# Patient Record
Sex: Male | Born: 1995 | Hispanic: Yes | Marital: Married | Smoking: Never smoker
Health system: Southern US, Community
[De-identification: ages and names within clinical notes are randomized; demographics above are authoritative.]

## PROBLEM LIST (undated history)

## (undated) DIAGNOSIS — E119 Type 2 diabetes mellitus without complications: Secondary | ICD-10-CM

## (undated) HISTORY — PX: LIVER SURGERY: SHX698

---

## 2021-08-27 ENCOUNTER — Inpatient Hospital Stay (HOSPITAL_COMMUNITY)
Admission: EM | Admit: 2021-08-27 | Discharge: 2021-09-07 | DRG: 442 | Disposition: A | Discharge status: Home / Self Care | Payer: Self-pay | Attending: Student | Admitting: Student

## 2021-08-27 ENCOUNTER — Other Ambulatory Visit: Payer: Self-pay

## 2021-08-27 ENCOUNTER — Encounter (HOSPITAL_COMMUNITY): Payer: Self-pay

## 2021-08-27 DIAGNOSIS — K750 Abscess of liver: Principal | ICD-10-CM | POA: Diagnosis present

## 2021-08-27 DIAGNOSIS — B9620 Unspecified Escherichia coli [E. coli] as the cause of diseases classified elsewhere: Secondary | ICD-10-CM | POA: Diagnosis present

## 2021-08-27 DIAGNOSIS — D72829 Elevated white blood cell count, unspecified: Secondary | ICD-10-CM | POA: Diagnosis present

## 2021-08-27 DIAGNOSIS — Z758 Other problems related to medical facilities and other health care: Secondary | ICD-10-CM

## 2021-08-27 DIAGNOSIS — D72825 Bandemia: Secondary | ICD-10-CM

## 2021-08-27 DIAGNOSIS — R1013 Epigastric pain: Secondary | ICD-10-CM

## 2021-08-27 DIAGNOSIS — Z789 Other specified health status: Secondary | ICD-10-CM

## 2021-08-27 DIAGNOSIS — D75839 Thrombocytosis, unspecified: Secondary | ICD-10-CM | POA: Diagnosis present

## 2021-08-27 DIAGNOSIS — E1165 Type 2 diabetes mellitus with hyperglycemia: Secondary | ICD-10-CM | POA: Diagnosis present

## 2021-08-27 DIAGNOSIS — E871 Hypo-osmolality and hyponatremia: Secondary | ICD-10-CM

## 2021-08-27 DIAGNOSIS — D649 Anemia, unspecified: Secondary | ICD-10-CM

## 2021-08-27 HISTORY — DX: Type 2 diabetes mellitus without complications: E11.9

## 2021-08-27 NOTE — ED Triage Notes (Signed)
Patient has been vomiting for 3 days. Yesterday he had dark stools, today he had bright red in his stools. Having epigastric abdominal pain.

## 2021-08-28 ENCOUNTER — Encounter (HOSPITAL_COMMUNITY): Payer: Self-pay | Admitting: Internal Medicine

## 2021-08-28 ENCOUNTER — Emergency Department (HOSPITAL_COMMUNITY): Payer: Self-pay

## 2021-08-28 ENCOUNTER — Inpatient Hospital Stay (HOSPITAL_COMMUNITY): Payer: Self-pay

## 2021-08-28 DIAGNOSIS — R1013 Epigastric pain: Secondary | ICD-10-CM

## 2021-08-28 DIAGNOSIS — E1165 Type 2 diabetes mellitus with hyperglycemia: Secondary | ICD-10-CM

## 2021-08-28 DIAGNOSIS — K750 Abscess of liver: Secondary | ICD-10-CM | POA: Diagnosis present

## 2021-08-28 LAB — HIV ANTIBODY (ROUTINE TESTING W REFLEX): HIV Screen 4th Generation wRfx: NONREACTIVE

## 2021-08-28 LAB — LACTIC ACID, PLASMA
Lactic Acid, Venous: 1.2 mmol/L (ref 0.5–1.9)
Lactic Acid, Venous: 1 mmol/L (ref 0.5–1.9)

## 2021-08-28 LAB — COMPREHENSIVE METABOLIC PANEL
ALT: 39 U/L (ref 0–44)
AST: 28 U/L (ref 15–41)
Albumin: 2.9 g/dL — ABNORMAL LOW (ref 3.5–5.0)
Alkaline Phosphatase: 249 U/L — ABNORMAL HIGH (ref 38–126)
Anion gap: 15 (ref 5–15)
BUN: 10 mg/dL (ref 6–20)
CO2: 26 mmol/L (ref 22–32)
Calcium: 9.2 mg/dL (ref 8.9–10.3)
Chloride: 88 mmol/L — ABNORMAL LOW (ref 98–111)
Creatinine, Ser: 0.81 mg/dL (ref 0.61–1.24)
GFR, Estimated: >60 mL/min (ref >60)
Glucose, Bld: 306 mg/dL — ABNORMAL HIGH (ref 70–99)
Potassium: 4.1 mmol/L (ref 3.5–5.1)
Sodium: 129 mmol/L — ABNORMAL LOW (ref 135–145)
Total Bilirubin: 1.3 mg/dL — ABNORMAL HIGH (ref 0.3–1.2)
Total Protein: 9 g/dL — ABNORMAL HIGH (ref 6.5–8.1)

## 2021-08-28 LAB — URINALYSIS, ROUTINE W REFLEX MICROSCOPIC
Bilirubin Urine: NEGATIVE
Glucose, UA: >=500 mg/dL — AB
Ketones, ur: 80 mg/dL — AB
Leukocytes,Ua: NEGATIVE
Nitrite: NEGATIVE
Protein, ur: 100 mg/dL — AB
Specific Gravity, Urine: 1.028 (ref 1.005–1.030)
pH: 6 (ref 5.0–8.0)

## 2021-08-28 LAB — CBC
HCT: 40.7 % (ref 39.0–52.0)
Hemoglobin: 13.5 g/dL (ref 13.0–17.0)
MCH: 27.3 pg (ref 26.0–34.0)
MCHC: 33.2 g/dL (ref 30.0–36.0)
MCV: 82.2 fL (ref 80.0–100.0)
Platelets: 864 10*3/uL — ABNORMAL HIGH (ref 150–400)
RBC: 4.95 MIL/uL (ref 4.22–5.81)
RDW: 11.9 % (ref 11.5–15.5)
WBC: 19 10*3/uL — ABNORMAL HIGH (ref 4.0–10.5)
nRBC: 0 % (ref 0.0–0.2)

## 2021-08-28 LAB — SEDIMENTATION RATE: Sed Rate: 125 mm/hr — ABNORMAL HIGH (ref 0–16)

## 2021-08-28 LAB — GLUCOSE, CAPILLARY
Glucose-Capillary: 298 mg/dL — ABNORMAL HIGH (ref 70–99)
Glucose-Capillary: 204 mg/dL — ABNORMAL HIGH (ref 70–99)
Glucose-Capillary: 303 mg/dL — ABNORMAL HIGH (ref 70–99)
Glucose-Capillary: 256 mg/dL — ABNORMAL HIGH (ref 70–99)

## 2021-08-28 LAB — HEMOGLOBIN A1C
Hgb A1c MFr Bld: 12.3 % — ABNORMAL HIGH (ref 4.8–5.6)
Mean Plasma Glucose: 306.31 mg/dL

## 2021-08-28 LAB — LIPASE, BLOOD: Lipase: 22 U/L (ref 11–51)

## 2021-08-28 LAB — C-REACTIVE PROTEIN: CRP: 23.1 mg/dL — ABNORMAL HIGH (ref <1.0)

## 2021-08-28 MED ORDER — INSULIN ASPART 100 UNIT/ML IJ SOLN
0.0000 [IU] | Freq: Three times a day (TID) | INTRAMUSCULAR | Status: DC
  Administered 2021-08-28: 5 [IU] via SUBCUTANEOUS
  Administered 2021-08-28: 3 [IU] via SUBCUTANEOUS
  Administered 2021-08-28: 8 [IU] via SUBCUTANEOUS
  Administered 2021-08-28 – 2021-08-29 (×2): 11 [IU] via SUBCUTANEOUS
  Administered 2021-08-29 (×2): 8 [IU] via SUBCUTANEOUS
  Administered 2021-08-29 – 2021-08-30 (×2): 11 [IU] via SUBCUTANEOUS
  Administered 2021-08-30: 8 [IU] via SUBCUTANEOUS
  Administered 2021-08-30: 5 [IU] via SUBCUTANEOUS
  Administered 2021-08-30: 15 [IU] via SUBCUTANEOUS
  Administered 2021-08-31: 8 [IU] via SUBCUTANEOUS
  Administered 2021-08-31: 3 [IU] via SUBCUTANEOUS
  Administered 2021-08-31: 8 [IU] via SUBCUTANEOUS
  Administered 2021-08-31 – 2021-09-01 (×2): 5 [IU] via SUBCUTANEOUS
  Administered 2021-09-01: 3 [IU] via SUBCUTANEOUS
  Administered 2021-09-01: 11 [IU] via SUBCUTANEOUS
  Administered 2021-09-01 – 2021-09-02 (×2): 3 [IU] via SUBCUTANEOUS
  Administered 2021-09-02: 5 [IU] via SUBCUTANEOUS
  Administered 2021-09-03: 8 [IU] via SUBCUTANEOUS
  Administered 2021-09-03: 5 [IU] via SUBCUTANEOUS
  Administered 2021-09-03 – 2021-09-04 (×2): 3 [IU] via SUBCUTANEOUS
  Administered 2021-09-04: 5 [IU] via SUBCUTANEOUS
  Administered 2021-09-04: 2 [IU] via SUBCUTANEOUS
  Administered 2021-09-04: 5 [IU] via SUBCUTANEOUS
  Administered 2021-09-05: 8 [IU] via SUBCUTANEOUS
  Administered 2021-09-05: 2 [IU] via SUBCUTANEOUS
  Administered 2021-09-05: 5 [IU] via SUBCUTANEOUS
  Administered 2021-09-06 (×2): 2 [IU] via SUBCUTANEOUS
  Administered 2021-09-06: 5 [IU] via SUBCUTANEOUS
  Administered 2021-09-07: 3 [IU] via SUBCUTANEOUS

## 2021-08-28 MED ORDER — PIPERACILLIN-TAZOBACTAM 3.375 G IVPB
3.3750 g | Freq: Three times a day (TID) | INTRAVENOUS | Status: DC
  Administered 2021-08-28 – 2021-09-07 (×31): 3.375 g via INTRAVENOUS
  Filled 2021-08-28 (×30): qty 50

## 2021-08-28 MED ORDER — ACETAMINOPHEN 650 MG RE SUPP: 650.0000 mg | Freq: Four times a day (QID) | RECTAL | Status: DC | PRN

## 2021-08-28 MED ORDER — ONDANSETRON HCL 4 MG/2ML IJ SOLN
4.0000 mg | Freq: Once | INTRAMUSCULAR | Status: AC
  Administered 2021-08-28: 4 mg via INTRAVENOUS
  Filled 2021-08-28: qty 2

## 2021-08-28 MED ORDER — ONDANSETRON HCL 4 MG PO TABS: 4.0000 mg | ORAL_TABLET | Freq: Four times a day (QID) | ORAL | Status: DC | PRN

## 2021-08-28 MED ORDER — SODIUM CHLORIDE 0.9 % IV SOLN
INTRAVENOUS | Status: DC | PRN
  Administered 2021-08-28: 10 mL/h via INTRAVENOUS

## 2021-08-28 MED ORDER — ACETAMINOPHEN 325 MG PO TABS
650.0000 mg | ORAL_TABLET | Freq: Four times a day (QID) | ORAL | Status: DC | PRN
  Administered 2021-08-29 – 2021-09-03 (×3): 650 mg via ORAL
  Filled 2021-08-28 (×3): qty 2

## 2021-08-28 MED ORDER — MORPHINE SULFATE (PF) 2 MG/ML IV SOLN: 2.0000 mg | INTRAVENOUS | Status: DC | PRN

## 2021-08-28 MED ORDER — PANTOPRAZOLE 80MG IVPB - SIMPLE MED
80.0000 mg | Freq: Once | INTRAVENOUS | Status: AC
  Administered 2021-08-28: 80 mg via INTRAVENOUS
  Filled 2021-08-28: qty 80

## 2021-08-28 MED ORDER — PIPERACILLIN-TAZOBACTAM 3.375 G IVPB 30 MIN
3.3750 g | Freq: Once | INTRAVENOUS | Status: AC
  Administered 2021-08-28: 3.375 g via INTRAVENOUS
  Filled 2021-08-28: qty 50

## 2021-08-28 MED ORDER — IOHEXOL 300 MG/ML  SOLN
100.0000 mL | Freq: Once | INTRAMUSCULAR | Status: AC | PRN
Start: 2021-08-28 — End: 2021-08-28
  Administered 2021-08-28: 100 mL via INTRAVENOUS

## 2021-08-28 MED ORDER — ONDANSETRON HCL 4 MG/2ML IJ SOLN
4.0000 mg | Freq: Four times a day (QID) | INTRAMUSCULAR | Status: DC | PRN
  Administered 2021-08-29: 4 mg via INTRAVENOUS
  Filled 2021-08-28 (×2): qty 2

## 2021-08-28 MED ORDER — LACTATED RINGERS IV SOLN: INTRAVENOUS | Status: AC

## 2021-08-28 MED ORDER — OXYCODONE-ACETAMINOPHEN 5-325 MG PO TABS
1.0000 | ORAL_TABLET | ORAL | Status: DC | PRN
  Filled 2021-08-28: qty 1

## 2021-08-28 MED ORDER — POLYETHYLENE GLYCOL 3350 17 G PO PACK: 17.0000 g | PACK | Freq: Every day | ORAL | Status: DC | PRN

## 2021-08-28 MED ORDER — SODIUM CHLORIDE 0.9 % IV BOLUS
1000.0000 mL | Freq: Once | INTRAVENOUS | Status: AC
  Administered 2021-08-28: 1000 mL via INTRAVENOUS

## 2021-08-28 MED ORDER — GADOBUTROL 1 MMOL/ML IV SOLN
6.0000 mL | Freq: Once | INTRAVENOUS | Status: AC | PRN
  Administered 2021-08-28: 6 mL via INTRAVENOUS

## 2021-08-28 NOTE — Progress Notes (Signed)
  Progress Note   Patient: Adam Skinner XGX:[id number] DOB: 10-Nov-1995 DOA: 08/27/2021     0 DOS: the patient was seen and examined on 08/28/2021   Brief hospital course: 26 year old male with past medical history of diabetes mellitus type 2 who presented to [facility] emergency department with complaints of nausea and vomiting with epigastric pain.  CT imaging of the abdomen and pelvis was performed revealing a 7 cm poorly defined hypodense liver lesion in the right lobe of the liver was an additional 1.4 cm lesion also noted.  Thought to be hepatic abscesses however metastatic disease cannot be excluded.  Assessment and Plan: * Liver abscess At least 1 liver lesion on CT imaging of the abdomen in the setting of substantial leukocytosis and thrombocytosis F/u Entamoeba histolytica antibodies, CRP, ESR. Cont empiric intravenous Zosyn per ID recs Per ID, if ab neg for E histolytica, then plan consult IR for drainage  Uncontrolled diabetes mellitus with hyperglycemia, without long-term current use of insulin ([place]) Holding outpatient regimen of daily Amaryl Patient exhibiting poor control on arrival with blood sugars in the 300s with no evidence of anion gap Cont with SSI coverage Hemoglobin A1C pending Diabetic Diet     Subjective: Denies abd pain or nausea. Tolerating diet  Physical Exam: Vitals:   08/28/21 0356 08/28/21 0555 08/28/21 1002 08/28/21 1443  BP: 120/70 114/67 116/70 121/69  Pulse: 92 94 93 94  Resp: _0 Temp: 98.9 F (37.2 C) 99.3 F (37.4 C) 98.5 F (36.9 C) 98.4 F (36.9 C)  TempSrc: Oral Oral Oral Oral  SpO2: 98% 98% 99% 98%  Weight: 65.9 kg     Height: _1  (1.778 m)      General exam: Awake, laying in bed, in nad Respiratory system: Normal respiratory effort, no wheezing Cardiovascular system: regular rate, s1, s2 Gastrointestinal system: Soft, nondistended, positive BS Central nervous system: CN2-12 grossly intact, strength  intact Extremities: Perfused, no clubbing Skin: Normal skin turgor, no notable skin lesions seen Psychiatry: Mood normal // no visual hallucinations   Data Reviewed:  Labs reviewed: Na 129, Cr 0.81, TB 1.3, WBC 19  Family Communication: Pt in room, family not at bedside  Disposition: Status is: Inpatient Remains inpatient appropriate because: Severity of illness  Planned Discharge Destination: Home    Author: Marylu Lund, MD 08/28/2021 4:09 PM  For on call review [email or web address].

## 2021-08-28 NOTE — Assessment & Plan Note (Addendum)
   At least 1 liver lesion on CT imaging of the abdomen in the setting of substantial leukocytosis and thrombocytosis  While abscess is the most likely finding I am also particularly concerned for the possibility of an amoeba such as Entamoeba histolytica considering patient's recent travel to [place] and complains of intermittent blood in the stool.  Radiology is recommending follow-up with MRI imaging to better characterize the lesions  Additionally obtaining Entamoeba histolytica antibodies, CRP, ESR.  We will consult infectious disease on day shift for guidance  Once this is performed we will obtain infectious disease consultation for assistance with antibiotic management as well as interventional radiology consultation for aspiration  Development of bacterial abscess may be secondary to uncontrolled diabetes  Treating patient with intravenous Zosyn in the meantime  Additionally hydrating patient with intravenous isotonic fluids  Blood cultures have been obtained  As needed opiate-based analgesics for associated pain

## 2021-08-28 NOTE — Assessment & Plan Note (Addendum)
.   Holding outpatient regimen of daily Amaryl . Patient exhibiting poor control on arrival with blood sugars in the 300s with no evidence of anion gap . Patient been placed on Accu-Cheks before every meal and nightly with sliding scale insulins . Hemoglobin A1C ordered . Diabetic Diet

## 2021-08-28 NOTE — H&P (Addendum)
History and Physical    Patient: Adam Skinner MRN: [id number] DOA: 08/27/2021  Date of Service: the patient was seen and examined on 08/28/2021  Patient coming from: Home  Chief Complaint:  Chief Complaint  Patient presents with   Abdominal Pain    HPI:   26 year old male with past medical history of diabetes mellitus type 2 who presented to [facility] emergency department with complaints of nausea and vomiting with epigastric pain.    Patient explains that for the past 1 month he has had a sensation of generalized malaise and weakness.  This been associated with a 10 pound unintentional weight loss with intermittent subjective fevers and chills.  Of note, patient does endorse traveling to [place] approximately 2 months ago.  Patient's symptoms continued to worsen and over several days in particular the patient has lost his appetite altogether.  Over the past 1 to 2 days the patient has developed worsening nausea with frequent bouts of nonbilious nonbloody vomiting.  Patient also complains of a 1 day history of dark stool with "blood."  Upon further questioning of this she states that the blood is a small amount, bright red and in the form of streaks on his toilet paper consistent with hemorrhoids.  Due to patient's progressively worsening abdominal pain, nausea, vomiting, fevers, chills in setting of increasing weakness malaise and weight loss the patient eventually presented to [facility] emergency department for evaluation.  Patient denies any fevers, sick contacts, shortness of breath, chest pain, cough low back pain or dysuria.  Upon evaluation in the emergency department initial work-up revealed a substantial leukocytosis with white blood cell count of 19 and substantial thrombocytosis with platelet count of 864.  Due to abdominal complaints CT imaging of the abdomen and pelvis was performed revealing a 7 cm poorly defined hypodense liver lesion in the  right lobe of the liver was an additional 1.4 cm lesion also noted.  Thought to be hepatic abscesses however metastatic disease cannot be excluded.  Radiology is recommending obtaining MRI.  ER provider initiated intravenous Zosyn and the hospitalist group has now been called to assess the patient for admission to the hospital.    Review of Systems: Review of Systems  Gastrointestinal:  Positive for abdominal pain, blood in stool, nausea and vomiting.  Neurological:  Positive for weakness.     Past Medical History:  Diagnosis Date   Diabetes ([place])     History reviewed. No pertinent surgical history.  Social History:  reports that he has never smoked. He has never used smokeless tobacco. He reports that he does not currently use alcohol. He reports that he does not use drugs.  No Known Allergies  Family History  Problem Relation Age of Onset   Heart disease Neg Hx     Prior to Admission medications   Not on File    Physical Exam:  Vitals:   08/28/21 0130 08/28/21 0200 08/28/21 0356 08/28/21 0555  BP: 120/78 118/72 120/70 114/67  Pulse: 88 85 92 94  Resp:   18 18  Temp:   98.9 F (37.2 C) 99.3 F (37.4 C)  TempSrc:   Oral Oral  SpO2: 100% 100% 98% 98%  Weight:   65.9 kg   Height:   '5\' 10"'$  (1.778 m)     Constitutional: Awake alert and oriented x3, no associated distress.  Thin build.   Skin: no rashes, no lesions, poor skin turgor noted. Eyes: Pupils are equally reactive to light.  No  evidence of scleral icterus or conjunctival pallor.  ENMT: Dry mucous membranes noted.  Posterior pharynx clear of any exudate or lesions.   Neck: normal, supple, no masses, no thyromegaly.  No evidence of jugular venous distension.   Respiratory: clear to auscultation bilaterally, no wheezing, no crackles. Normal respiratory effort. No accessory muscle use.  Cardiovascular: Regular rate and rhythm, no murmurs / rubs / gallops. No extremity edema. 2+ pedal pulses. No carotid bruits.   Chest:   Nontender without crepitus or deformity.   Back:   Nontender without crepitus or deformity. Abdomen: Epigastric and RUQ tenderness.  Abdomen is soft.  No evidence of intra-abdominal masses.  Positive bowel sounds noted in all quadrants.   Musculoskeletal: No joint deformity upper and lower extremities. Good ROM, no contractures. Normal muscle tone.  Neurologic: CN 2-12 grossly intact. Sensation intact.  Patient moving all 4 extremities spontaneously.  Patient is following all commands.  Patient is responsive to verbal stimuli.   Psychiatric: Patient exhibits normal mood with appropriate affect.  Patient seems to possess insight as to their current situation.    Data Reviewed:  I have personally reviewed and interpreted labs, imaging.  Significant findings are:  Blood cell count of 19, hemoglobin of 13.5, hematocrit of 40.7 and platelet count of 864. Chemistry revealing sodium 129, potassium 4.1, chloride 88, bicarbonate 26, BUN 10, creatinine 0.81.  Albumin 2.9.  Glucose 306. Lipase 22 Lactic acid 1.2 7 cm hypodense liver lesion of the right lobe concerning for developing abscess with additional 1.4 cm lesion.  Likely hepatic abscess formation although metastatic disease is also possible.  EKG: Personally reviewed.  Rhythm is NSR with heart rate of 94BPM.  No dynamic ST segment changes appreciated.    Assessment and Plan: * Liver abscess At least 1 liver lesion on CT imaging of the abdomen in the setting of substantial leukocytosis and thrombocytosis While abscess is the most likely finding I am also particularly concerned for the possibility of an amoeba such as Entamoeba histolytica considering patient's recent travel to [place] and complains of intermittent blood in the stool. Radiology is recommending follow-up with MRI imaging to better characterize the lesions Additionally obtaining Entamoeba histolytica antibodies, CRP, ESR. We will consult infectious disease on day  shift for guidance Once this is performed we will obtain infectious disease consultation for assistance with antibiotic management as well as interventional radiology consultation for aspiration Development of bacterial abscess may be secondary to uncontrolled diabetes Treating patient with intravenous Zosyn in the meantime Additionally hydrating patient with intravenous isotonic fluids Blood cultures have been obtained As needed opiate-based analgesics for associated pain  Uncontrolled diabetes mellitus with hyperglycemia, without long-term current use of insulin ([place]) Holding outpatient regimen of daily Amaryl Patient exhibiting poor control on arrival with blood sugars in the 300s with no evidence of anion gap Patient been placed on Accu-Cheks before every meal and nightly with sliding scale insulins Hemoglobin A1C ordered Diabetic Diet        Code Status:  Full code  code status decision has been confirmed with: patient Family Communication: deferred   Consults: Secure Chat consult request sent to Dr. Linus Salmons with Infectious Disease  Severity of Illness:  The appropriate patient status for this patient is INPATIENT. Inpatient status is judged to be reasonable and necessary in order to provide the required intensity of service to ensure the patient's safety. The patient's presenting symptoms, physical exam findings, and initial radiographic and laboratory data in the context of their chronic  comorbidities is felt to place them at high risk for further clinical deterioration. Furthermore, it is not anticipated that the patient will be medically stable for discharge from the hospital within 2 midnights of admission.   * I certify that at the point of admission it is my clinical judgment that the patient will require inpatient hospital care spanning beyond 2 midnights from the point of admission due to high intensity of service, high risk for further deterioration and high frequency of  surveillance required.*  Author:  Vernelle Emerald MD  08/28/2021 9:16 AM

## 2021-08-28 NOTE — ED Provider Notes (Signed)
Emergency Department Provider Note   I have reviewed the triage vital signs and the nursing notes.   HISTORY  Chief Complaint Abdominal Pain   Patient is accompanied by family who assist with interpretation.   HPI Adam Skinner is a 26 y.o. male with PMH of DM presents to the ED with epigastric abdominal pain, vomiting, fatigue, and intermittent dark BMs for the last 5 days. No similar symptoms in the past. No new medications. Patient having some subjective fever. No diarrhea. Patient notes some dark, hard stool and some brighter red streaking in the stool today. No CP. Only recent travel was to [place] 2 months prior.     Past Medical History:  Diagnosis Date   Diabetes (HCC)     Review of Systems  Constitutional: No fever/chills Eyes: No visual changes. ENT: No sore throat. Cardiovascular: Denies chest pain. Respiratory: Denies shortness of breath. Gastrointestinal: Positive abdominal pain. Positive nausea and vomiting.  No diarrhea.  No constipation. Genitourinary: Negative for dysuria. Musculoskeletal: Negative for back pain. Skin: Negative for rash. Neurological: Negative for headaches, focal weakness or numbness.  ____________________________________________   PHYSICAL EXAM:  VITAL SIGNS: ED Triage Vitals [08/27/21 2311]  Enc Vitals Group     BP 133/90     Pulse Rate (!) 120     Resp 16     Temp 99.2 F (37.3 C)     Temp Source Oral     SpO2 98 %    Constitutional: Alert and oriented. Well appearing and in no acute distress. Eyes: Conjunctivae are normal.  Head: Atraumatic. Nose: No congestion/rhinnorhea. Mouth/Throat: Mucous membranes are moist. Neck: No stridor.   Cardiovascular: Normal rate, regular rhythm. Good peripheral circulation. Grossly normal heart sounds.   Respiratory: Normal respiratory effort.  No retractions. Lungs CTAB. Gastrointestinal: Soft and nontender. No distention.  Musculoskeletal: No lower extremity tenderness  nor edema. No gross deformities of extremities. Neurologic:  Normal speech and language. No gross focal neurologic deficits are appreciated.  Skin:  Skin is warm, dry and intact. No rash noted.  ____________________________________________   LABS (all labs ordered are listed, but only abnormal results are displayed)  Labs Reviewed  COMPREHENSIVE METABOLIC PANEL - Abnormal; Notable for the following components:      Result Value   Sodium 129 (*)    Chloride 88 (*)    Glucose, Bld 306 (*)    Total Protein 9.0 (*)    Albumin 2.9 (*)    Alkaline Phosphatase 249 (*)    Total Bilirubin 1.3 (*)    All other components within normal limits  CBC - Abnormal; Notable for the following components:   WBC 19.0 (*)    Platelets 864 (*)    All other components within normal limits  URINALYSIS, ROUTINE W REFLEX MICROSCOPIC - Abnormal; Notable for the following components:   Glucose, UA >=500 (*)    Hgb urine dipstick MODERATE (*)    Ketones, ur 80 (*)    Protein, ur 100 (*)    Bacteria, UA RARE (*)    All other components within normal limits  CULTURE, BLOOD (ROUTINE X 2)  CULTURE, BLOOD (ROUTINE X 2)  LIPASE, BLOOD  LACTIC ACID, PLASMA  LACTIC ACID, PLASMA  HIV ANTIBODY (ROUTINE TESTING W REFLEX)   ____________________________________________  RADIOLOGY  CT ABDOMEN PELVIS W CONTRAST  Result Date: 08/28/2021 CLINICAL DATA:  Nausea, vomiting, and acute nonlocalized abdominal pain. EXAM: CT ABDOMEN AND PELVIS WITH CONTRAST TECHNIQUE: Multidetector CT imaging of the abdomen and  pelvis was performed using the standard protocol following bolus administration of intravenous contrast. RADIATION DOSE REDUCTION: This exam was performed according to the departmental dose-optimization program which includes automated exposure control, adjustment of the mA and/or kV according to patient size and/or use of iterative reconstruction technique. CONTRAST:  OMNIPAQUE IOHEXOL 300 MG/ML  SOLN  COMPARISON:  None Available. FINDINGS: Lower chest: Lung bases are clear. Hepatobiliary: Somewhat poorly defined hypodense liver lesion involving the right lobe and measuring about 7 cm in maximal diameter. A smaller circumscribed low-attenuation lesion is identified in hepatic segment 4 measuring 1.4 cm diameter. In a patient of this age, this is most likely to represent hepatic abscess. Metastasis or primary liver lesion could also have this appearance. Consider follow-up with MRI of the liver in the nonemergent setting. Gallbladder and bile ducts are unremarkable. Pancreas: Unremarkable. No pancreatic ductal dilatation or surrounding inflammatory changes. Spleen: Normal in size without focal abnormality. Adrenals/Urinary Tract: Adrenal glands are unremarkable. Kidneys are normal, without renal calculi, focal lesion, or hydronephrosis. Bladder is unremarkable. Stomach/Bowel: Stomach is within normal limits. Appendix appears normal. No evidence of bowel wall thickening, distention, or inflammatory changes. Vascular/Lymphatic: No significant vascular findings are present. No enlarged abdominal or pelvic lymph nodes. Reproductive: Prostate gland appears enlarged for age. Seminal vesicles are prominent. Possibly inflammatory process. Other: No free air or free fluid in the abdomen. Abdominal wall musculature appears intact. Musculoskeletal: No acute or significant osseous findings. IMPRESSION: 1. 7 cm poorly defined hypodense liver lesion in the right lobe with a 1.4 cm lesion in segment 4. This likely represents a hepatic abscess although primary or metastatic neoplasm could also have this appearance. Suggest follow-up with elective MRI of the liver. 2. Prostate gland and seminal vesicles are prominent for age, possibly inflammatory process. Electronically Signed   By: Burman Nieves M.D.   On: 08/28/2021 01:40    ____________________________________________   PROCEDURES  Procedure(s) performed:    Procedures  None  ____________________________________________   INITIAL IMPRESSION / ASSESSMENT AND PLAN / ED COURSE  Pertinent labs & imaging results that were available during my care of the patient were reviewed by me and considered in my medical decision making (see chart for details).   This patient is Presenting for Evaluation of abdominal pain, which does require a range of treatment options, and is a complaint that involves a high risk of morbidity and mortality.  The Differential Diagnoses includes but is not exclusive to acute cholecystitis, intrathoracic causes for epigastric abdominal pain, gastritis, duodenitis, pancreatitis, small bowel or large bowel obstruction, abdominal aortic aneurysm, hernia, gastritis, etc.   Critical Interventions-    Medications  piperacillin-tazobactam (ZOSYN) IVPB 3.375 g (has no administration in time range)  sodium chloride 0.9 % bolus 1,000 mL (0 mLs Intravenous Stopped 08/28/21 0130)  pantoprazole (PROTONIX) 80 mg /NS 100 mL IVPB (0 mg Intravenous Stopped 08/28/21 0130)  ondansetron (ZOFRAN) injection 4 mg (4 mg Intravenous Given 08/28/21 0045)  iohexol (OMNIPAQUE) 300 MG/ML solution 100 mL (100 mLs Intravenous Contrast Given 08/28/21 0111)    Reassessment after intervention: Symptoms improved.    I did obtain Additional Historical Information from family at bedside.    Clinical Laboratory Tests Ordered, included leukocytosis noted without anemia. No evidence of UTI.   Radiologic Tests Ordered, included CT abdomen/pelvis. I independently interpreted the images and agree with radiology interpretation.   Cardiac Monitor Tracing which shows NSR.   Social Determinants of Health Risk no regular EtOH.   Consult complete with Hospitalist, plan  for admit.   Medical Decision Making: Summary:  Patient presents to the emergency department for evaluation of abdominal pain with vomiting.  Some subjective fever noted.  Afebrile here with  normal vital signs.  Mild tenderness on exam.  Plan for CT imaging given leukocytosis and tenderness on exam and reassess.  Reevaluation with update and discussion with patient. Discussed CT findings and plan for abx and admit. He is in agreement.   Disposition: admit  ____________________________________________  FINAL CLINICAL IMPRESSION(S) / ED DIAGNOSES  Final diagnoses:  Liver abscess  Epigastric pain   Note:  This document was prepared using Dragon voice recognition software and may include unintentional dictation errors.  Alona Bene, MD, [facility] Emergency Medicine    Marice Guidone, Arlyss Repress, MD 08/28/21 [phone]

## 2021-08-28 NOTE — Progress Notes (Signed)
Pharmacy Antibiotic Note  Adam Skinner is a 26 y.o. male admitted on 08/27/2021 with Patient has been vomiting for 3 days. Yesterday he had dark stools, today he had bright red in his stools. Having epigastric abdominal pain. Marland Kitchen  Pharmacy has been consulted for zosyn dosing.  Plan: Zosyn 3.375g IV Q8H infused over 4hrs. Pharmacy will sign off and follow peripherally  Height: 5\' 10"  (177.8 cm) Weight: 65.9 kg (145 lb 4.5 oz) IBW/kg (Calculated) : 73  Temp (24hrs), Avg:98.9 F (37.2 C), Min:98.7 F (37.1 C), Max:99.2 F (37.3 C)  Recent Labs  Lab 08/27/21 2341 08/28/21 0228 08/28/21 0430  WBC 19.0*  --   --   CREATININE 0.81  --   --   LATICACIDVEN  --  1.2 1.0    Estimated Creatinine Clearance: 129.9 mL/min (by C-G formula based on SCr of 0.81 mg/dL).    No Known Allergies   Thank you for allowing pharmacy to be a part of this patient's care.  08/30/21 RPh 08/28/2021, 5:47 AM

## 2021-08-28 NOTE — ED Notes (Signed)
Patient transported to CT 

## 2021-08-28 NOTE — Consult Note (Signed)
    [facility]       Reason for Consult:liver abscess   Referring Physician: Dr. Leafy Half  Principal Problem:   Liver abscess Active Problems:   Uncontrolled diabetes mellitus with hyperglycemia, without long-term current use of insulin ([facility])    insulin aspart  0-15 Units Subcutaneous TID AC & HS    Recommendations: Continue piperacillin/tazobactam E histolytica If Ab negative for E histolytica, consult IR for drainage  Assessment: Liver abscess though MRI ordered since CT not definitive.  If abscess, need to be sure it is not amoebic prior to attempts at drainage.    Antibiotics: Pip/tazo  HPI: Adam Skinner is a 26 y.o. male from [place] in the [place] x 3 months, here with fatigue, fever, chills and CT scan with a liver abscess.  Has lost some weight.  Symptoms particularly in the last 2 days.  + leukocytosis of 19.0, afebrile here.  Eating ok.  Does eat street food in [place]  No history of liver issues.   Review of Systems:  Constitutional: positive for fevers and chills All other systems reviewed and are negative    Past Medical History:  Diagnosis Date   Diabetes (HCC)     Social History   Tobacco Use   Smoking status: Never   Smokeless tobacco: Never  Substance Use Topics   Alcohol use: Not Currently   Drug use: Never    Family History  Problem Relation Age of Onset   Heart disease Neg Hx     No Known Allergies  Physical Exam: Constitutional: in no apparent distress  Vitals:   08/28/21 0555 08/28/21 1002  BP: 114/67 116/70  Pulse: 94 93  Resp: 18 16  Temp: 99.3 F (37.4 C) 98.5 F (36.9 C)  SpO2: 98% 99%   EYES: anicteric Respiratory: normal respiratory effort GI: soft Musculoskeletal: no edema Skin: no rash  Lab Results  Component Value Date   WBC 19.0 (H) 08/27/2021   HGB 13.5 08/27/2021   HCT 40.7 08/27/2021   MCV 82.2 08/27/2021   PLT 864 (H) 08/27/2021    Lab Results  Component Value  Date   CREATININE 0.81 08/27/2021   BUN 10 08/27/2021   NA 129 (L) 08/27/2021   K 4.1 08/27/2021   CL 88 (L) 08/27/2021   CO2 26 08/27/2021    Lab Results  Component Value Date   ALT 39 08/27/2021   AST 28 08/27/2021   ALKPHOS 249 (H) 08/27/2021     Microbiology: No results found for this or any previous visit (from the past 240 hour(s)).  Gardiner Barefoot, MD [facility] [email or web address] 08/28/2021, 1:33 PM

## 2021-08-28 NOTE — Hospital Course (Signed)
26 year old male with past medical history of diabetes mellitus type 2 who presented to [facility] emergency department with complaints of nausea and vomiting with epigastric pain.  CT imaging of the abdomen and pelvis was performed revealing a 7 cm poorly defined hypodense liver lesion in the right lobe of the liver was an additional 1.4 cm lesion also noted.  Thought to be hepatic abscesses however metastatic disease cannot be excluded.

## 2021-08-29 DIAGNOSIS — R1013 Epigastric pain: Secondary | ICD-10-CM

## 2021-08-29 LAB — MAGNESIUM: Magnesium: 1.8 mg/dL (ref 1.7–2.4)

## 2021-08-29 LAB — CBC WITH DIFFERENTIAL/PLATELET
Abs Immature Granulocytes: 0.08 10*3/uL — ABNORMAL HIGH (ref 0.00–0.07)
Basophils Absolute: 0 10*3/uL (ref 0.0–0.1)
Basophils Relative: 0 %
Eosinophils Absolute: 0 10*3/uL (ref 0.0–0.5)
Eosinophils Relative: 0 %
HCT: 35 % — ABNORMAL LOW (ref 39.0–52.0)
Hemoglobin: 11.5 g/dL — ABNORMAL LOW (ref 13.0–17.0)
Immature Granulocytes: 1 %
Lymphocytes Relative: 5 %
Lymphs Abs: 0.5 10*3/uL — ABNORMAL LOW (ref 0.7–4.0)
MCH: 27 pg (ref 26.0–34.0)
MCHC: 32.9 g/dL (ref 30.0–36.0)
MCV: 82.2 fL (ref 80.0–100.0)
Monocytes Absolute: 0.2 10*3/uL (ref 0.1–1.0)
Monocytes Relative: 2 %
Neutro Abs: 10 10*3/uL — ABNORMAL HIGH (ref 1.7–7.7)
Neutrophils Relative %: 92 %
Platelets: 603 10*3/uL — ABNORMAL HIGH (ref 150–400)
RBC: 4.26 MIL/uL (ref 4.22–5.81)
RDW: 11.9 % (ref 11.5–15.5)
WBC: 10.9 10*3/uL — ABNORMAL HIGH (ref 4.0–10.5)
nRBC: 0 % (ref 0.0–0.2)

## 2021-08-29 LAB — COMPREHENSIVE METABOLIC PANEL
ALT: 34 U/L (ref 0–44)
AST: 35 U/L (ref 15–41)
Albumin: 2.3 g/dL — ABNORMAL LOW (ref 3.5–5.0)
Alkaline Phosphatase: 212 U/L — ABNORMAL HIGH (ref 38–126)
Anion gap: 11 (ref 5–15)
BUN: 9 mg/dL (ref 6–20)
CO2: 27 mmol/L (ref 22–32)
Calcium: 8.4 mg/dL — ABNORMAL LOW (ref 8.9–10.3)
Chloride: 92 mmol/L — ABNORMAL LOW (ref 98–111)
Creatinine, Ser: 0.76 mg/dL (ref 0.61–1.24)
GFR, Estimated: >60 mL/min (ref >60)
Glucose, Bld: 284 mg/dL — ABNORMAL HIGH (ref 70–99)
Potassium: 3.4 mmol/L — ABNORMAL LOW (ref 3.5–5.1)
Sodium: 130 mmol/L — ABNORMAL LOW (ref 135–145)
Total Bilirubin: 0.9 mg/dL (ref 0.3–1.2)
Total Protein: 7 g/dL (ref 6.5–8.1)

## 2021-08-29 LAB — GLUCOSE, CAPILLARY
Glucose-Capillary: 308 mg/dL — ABNORMAL HIGH (ref 70–99)
Glucose-Capillary: 331 mg/dL — ABNORMAL HIGH (ref 70–99)
Glucose-Capillary: 285 mg/dL — ABNORMAL HIGH (ref 70–99)
Glucose-Capillary: 306 mg/dL — ABNORMAL HIGH (ref 70–99)

## 2021-08-29 MED ORDER — POTASSIUM CHLORIDE CRYS ER 20 MEQ PO TBCR
60.0000 meq | EXTENDED_RELEASE_TABLET | Freq: Once | ORAL | Status: AC
  Administered 2021-08-29: 60 meq via ORAL
  Filled 2021-08-29: qty 3

## 2021-08-29 MED ORDER — INSULIN GLARGINE-YFGN 100 UNIT/ML ~~LOC~~ SOLN
12.0000 [IU] | Freq: Every day | SUBCUTANEOUS | Status: DC
Start: 2021-08-29 — End: 2021-08-30
  Administered 2021-08-29 – 2021-08-30 (×2): 12 [IU] via SUBCUTANEOUS
  Filled 2021-08-29 (×2): qty 0.12

## 2021-08-29 NOTE — Progress Notes (Signed)
Nutrition Note  RD consulted for nutrition education regarding diabetes.  Spanish Interpreter utilized: #[id number]  Lab Results  Component Value Date   HGBA1C 12.3 (H) 08/28/2021    RD provided "Carbohydrate Counting for People with Diabetes" handout from the Academy of Nutrition and Dietetics. Discussed different food groups and their effects on blood sugar, emphasizing carbohydrate-containing foods. Provided list of carbohydrates and recommended serving sizes of common foods.  Discussed importance of controlled and consistent carbohydrate intake throughout the day. Provided examples of ways to balance meals/snacks and encouraged intake of high-fiber, whole grain complex carbohydrates. Teach back method used.  Expect fair compliance. Pt reports having lost 10 lbs of weight loss since symptoms began. Was having vomiting and abdominal pain for 2-3 days PTA.  Pt now eating better. Did not like the meatloaf for lunch but ate everything else. Emphasized the importance of consuming protein foods with every meal and snack.  Body mass index is 20.85 kg/m. Pt meets criteria for normal based on current BMI.  Current diet order is CHO modified, patient is consuming approximately 90-100% of meals at this time. Labs and medications reviewed. No further nutrition interventions warranted at this time. If additional nutrition issues arise, please re-consult RD.  Tilda Franco, MS, RD, LDN Inpatient Clinical Dietitian Contact information available via Amion

## 2021-08-29 NOTE — Progress Notes (Signed)
Subjective: No new complaints   Antibiotics:  Anti-infectives (From admission, onward)    Start     Dose/Rate Route Frequency Ordered Stop   08/28/21 1000  piperacillin-tazobactam (ZOSYN) IVPB 3.375 g        3.375 g 12.5 mL/hr over 240 Minutes Intravenous Every 8 hours 08/28/21 0546     08/28/21 0215  piperacillin-tazobactam (ZOSYN) IVPB 3.375 g        3.375 g 100 mL/hr over 30 Minutes Intravenous  Once 08/28/21 0212 08/28/21 0333       Medications: Scheduled Meds:  insulin aspart  0-15 Units Subcutaneous TID AC & HS   insulin glargine-yfgn  12 Units Subcutaneous Daily   Continuous Infusions:  sodium chloride 10 mL/hr (08/28/21 1034)   piperacillin-tazobactam (ZOSYN)  IV 3.375 g (08/29/21 1655)   PRN Meds:.sodium chloride, acetaminophen **OR** acetaminophen, oxyCODONE-acetaminophen **OR** morphine injection, ondansetron **OR** ondansetron (ZOFRAN) IV, polyethylene glycol    Objective: Weight change:   Intake/Output Summary (Last 24 hours) at 08/29/2021 1835 Last data filed at 08/29/2021 1700 Gross per 24 hour  Intake 2005.51 ml  Output --  Net 2005.51 ml   Blood pressure 119/73, pulse 100, temperature 98.3 F (36.8 C), resp. rate 18, height 5\' 10"  (1.778 m), weight 65.9 kg, SpO2 99 %. Temp:  [97.5 F (36.4 C)-100.1 F (37.8 C)] 98.3 F (36.8 C) (06/19 1816) Pulse Rate:  [70-100] 100 (06/19 1816) Resp:  [16-20] 18 (06/19 1816) BP: (106-124)/(59-80) 119/73 (06/19 1816) SpO2:  [95 %-100 %] 99 % (06/19 1816)  Physical Exam: Physical Exam Constitutional:      Appearance: He is well-developed.  HENT:     Head: Normocephalic and atraumatic.  Eyes:     Conjunctiva/sclera: Conjunctivae normal.  Cardiovascular:     Rate and Rhythm: Normal rate and regular rhythm.  Pulmonary:     Effort: Pulmonary effort is normal. No respiratory distress.     Breath sounds: No wheezing.  Abdominal:     General: There is no distension.     Palpations: Abdomen is  soft. There is no mass.     Tenderness: There is abdominal tenderness.  Musculoskeletal:        General: Normal range of motion.     Cervical back: Normal range of motion and neck supple.  Skin:    General: Skin is warm and dry.     Findings: No erythema or rash.  Neurological:     General: No focal deficit present.     Mental Status: He is alert and oriented to person, place, and time.  Psychiatric:        Mood and Affect: Mood normal.        Behavior: Behavior normal.        Thought Content: Thought content normal.        Judgment: Judgment normal.      CBC:    BMET Recent Labs    08/27/21 2341 08/29/21 0354  NA 129* 130*  K 4.1 3.4*  CL 88* 92*  CO2 26 27  GLUCOSE 306* 284*  BUN 10 9  CREATININE 0.81 0.76  CALCIUM 9.2 8.4*     Liver Panel  Recent Labs    08/27/21 2341 08/29/21 0354  PROT 9.0* 7.0  ALBUMIN 2.9* 2.3*  AST 28 35  ALT 39 34  ALKPHOS 249* 212*  BILITOT 1.3* 0.9       Sedimentation Rate Recent Labs    08/28/21 0812  ESRSEDRATE 125*  C-Reactive Protein Recent Labs    08/28/21 0812  CRP 23.1*    Micro Results: Recent Results (from the past 720 hour(s))  Culture, blood (routine x 2)     Status: None (Preliminary result)   Collection Time: 08/28/21  2:28 AM   Specimen: BLOOD  Result Value Ref Range Status   Specimen Description   Final    BLOOD RIGHT ANTECUBITAL Performed at [facility], [address], [place], [place] [address]    Special Requests   Final    BOTTLES DRAWN AEROBIC AND ANAEROBIC Blood Culture adequate volume Performed at [facility], [address] [place], [place] [address]    Culture   Final    NO GROWTH < 24 HOURS Performed at [facility], [address] [place], [place] [address]    Report Status PENDING  Incomplete  Culture, blood (routine x 2)     Status: None (Preliminary result)   Collection Time: 08/28/21  2:28 AM   Specimen: BLOOD  Result  Value Ref Range Status   Specimen Description   Final    BLOOD LEFT ANTECUBITAL Performed at [facility], [address], [place], [place] [address]    Special Requests   Final    BOTTLES DRAWN AEROBIC AND ANAEROBIC Blood Culture adequate volume Performed at [facility], [address] [place], [place] [address]    Culture   Final    NO GROWTH < 24 HOURS Performed at [facility], [address], [place], [place] [address]    Report Status PENDING  Incomplete    Studies/Results: MR LIVER W WO CONTRAST  Result Date: 08/28/2021 CLINICAL DATA:  Indeterminate liver lesions on recent CT. EXAM: MRI ABDOMEN WITHOUT AND WITH CONTRAST TECHNIQUE: Multiplanar multisequence MR imaging of the abdomen was performed both before and after the administration of intravenous contrast. CONTRAST:  56mL GADAVIST GADOBUTROL 1 MMOL/ML IV SOLN COMPARISON:  CT on 08/28/2021 FINDINGS: Lower chest: No acute findings. Hepatobiliary: A complex cystic lesion is seen in segment 7 of the right hepatic lobe, measuring 8.2 x 5.5 cm. This shows thin peripherally enhancing wall and internal septations, and edema in the surrounding hepatic parenchyma. This is highly suspicious for liver abscess, with a necrotic neoplasm considered less likely. No other hepatic abscess or mass identified. Focal fatty infiltration is seen in segment 4 adjacent to the falciform ligament, which corresponds to the other lesion seen in this location on prior study. Gallbladder is unremarkable. No evidence of biliary ductal dilatation. Pancreas:  No mass or inflammatory changes. Spleen:  Within normal limits in size and appearance. Adrenals/Urinary Tract: No masses identified. No evidence of hydronephrosis. Stomach/Bowel: Unremarkable. Vascular/Lymphatic: No pathologically enlarged lymph nodes identified. No acute vascular findings. Other:  None. Musculoskeletal:  No suspicious bone lesions identified.  IMPRESSION: 8.2 cm complex cystic lesion in segment 7 of the right hepatic lobe, highly suspicious for liver abscess, with necrotic neoplasm considered less likely. Needle aspiration should be considered. Focal fatty infiltration in the left hepatic lobe, which corresponds to the other lesion seen on prior CT. Electronically Signed   By: Danae Orleans M.D.   On: 08/28/2021 16:02   CT ABDOMEN PELVIS W CONTRAST  Result Date: 08/28/2021 CLINICAL DATA:  Nausea, vomiting, and acute nonlocalized abdominal pain. EXAM: CT ABDOMEN AND PELVIS WITH CONTRAST TECHNIQUE: Multidetector CT imaging of the abdomen and pelvis was performed using the standard protocol following bolus administration of intravenous contrast. RADIATION  DOSE REDUCTION: This exam was performed according to the departmental dose-optimization program which includes automated exposure control, adjustment of the mA and/or kV according to patient size and/or use of iterative reconstruction technique. CONTRAST:  OMNIPAQUE IOHEXOL 300 MG/ML  SOLN COMPARISON:  None Available. FINDINGS: Lower chest: Lung bases are clear. Hepatobiliary: Somewhat poorly defined hypodense liver lesion involving the right lobe and measuring about 7 cm in maximal diameter. A smaller circumscribed low-attenuation lesion is identified in hepatic segment 4 measuring 1.4 cm diameter. In a patient of this age, this is most likely to represent hepatic abscess. Metastasis or primary liver lesion could also have this appearance. Consider follow-up with MRI of the liver in the nonemergent setting. Gallbladder and bile ducts are unremarkable. Pancreas: Unremarkable. No pancreatic ductal dilatation or surrounding inflammatory changes. Spleen: Normal in size without focal abnormality. Adrenals/Urinary Tract: Adrenal glands are unremarkable. Kidneys are normal, without renal calculi, focal lesion, or hydronephrosis. Bladder is unremarkable. Stomach/Bowel: Stomach is within normal limits.  Appendix appears normal. No evidence of bowel wall thickening, distention, or inflammatory changes. Vascular/Lymphatic: No significant vascular findings are present. No enlarged abdominal or pelvic lymph nodes. Reproductive: Prostate gland appears enlarged for age. Seminal vesicles are prominent. Possibly inflammatory process. Other: No free air or free fluid in the abdomen. Abdominal wall musculature appears intact. Musculoskeletal: No acute or significant osseous findings. IMPRESSION: 1. 7 cm poorly defined hypodense liver lesion in the right lobe with a 1.4 cm lesion in segment 4. This likely represents a hepatic abscess although primary or metastatic neoplasm could also have this appearance. Suggest follow-up with elective MRI of the liver. 2. Prostate gland and seminal vesicles are prominent for age, possibly inflammatory process. Electronically Signed   By: Burman Nieves M.D.   On: 08/28/2021 01:40      Assessment/Plan:  INTERVAL HISTORY: Entamoeba histolytica antibodies still pending   Principal Problem:   Liver abscess Active Problems:   Uncontrolled diabetes mellitus with hyperglycemia, without long-term current use of insulin (HCC)    Adam Skinner is a 26 y.o. male with history of poorly controlled diabetes mellitus admitted with apparent liver abscess.  He has been on empiric Zosyn and Entamoeba histolytica antibodies have been sent due to concerns that this could represent an amoebic liver abscess.  IR guided aspirate has been delayed due to concerns that aspiration of an actual amoebic liver abscess could result in peritonitis per Dr. Ephriam Knuckles recommendations in my discussions with him today.  #1 liver abscess:  Await Entamoeba histolytica serologies I suspect this is a pyogenic infection and he will need aspiration of the liver abscess with potentially placement of a drain.  Certainly if he undergoes this he will need cultures sent  For now we will continue Zosyn.    LOS: 1 day   Acey Lav 08/29/2021, 6:35 PM

## 2021-08-29 NOTE — Progress Notes (Addendum)
Inpatient Diabetes Program Recommendations  AACE/ADA: New Consensus Statement on Inpatient Glycemic Control (2015)  Target Ranges:  Prepandial:   less than 140 mg/dL      Peak postprandial:   less than 180 mg/dL (1-2 hours)      Critically ill patients:  140 - 180 mg/dL   Lab Results  Component Value Date   GLUCAP 308 (H) 08/29/2021   HGBA1C 12.3 (H) 08/28/2021    Review of Glycemic Control  Latest Reference Range & Units 08/28/21 11:13 08/28/21 15:52 08/28/21 21:23 08/29/21 08:03  Glucose-Capillary 70 - 99 mg/dL 204 (H) 303 (H) 256 (H) 308 (H)  (H): Data is abnormally high Diabetes history: Type 2 DM  Outpatient Diabetes medications: Amaryl 4 mg QD Current orders for Inpatient glycemic control: Novolog 0-15 units TID & Hs  Inpatient Diabetes Program Recommendations:    Consider adding Semglee 12 units QD. Will plan to see.    Spoke with patient regarding outpatient diabetes management.  Reviewed patient's current A1c of 12.3%. Explained what a A1c is and what it measures. Also reviewed goal A1c with patient, importance of good glucose control @ home, and blood sugar goals. Reviewed patho of DM, need for insulin, role of pancreas, survival skills, interventions, vascular changes, increased risk for infection and other commorbidities.  Patient educated on purchasing a meter and testing strips through [employer]. Educated on recommended frequency and when to call Md. Patient is interested in trying [employer]. Order obtained. Will place.  Educated patient on insulin pen use at home. Reviewed contents of insulin flexpen starter kit. Reviewed all steps if insulin pen including attachment of needle, 2-unit air shot, dialing up dose, giving injection, removing needle, disposal of sharps, storage of unused insulin, disposal of insulin etc. Patient able to provide successful return demonstration. Also reviewed troubleshooting with insulin pen. MD to give patient Rxs for insulin pens  and insulin pen needles. Discussed plan for 70/30 at discharge.  Will place TOC for PCP follow up.  Thanks, Bronson Curb, MSN, RNC-OB Diabetes Coordinator [phone] (8a-5p)

## 2021-08-29 NOTE — Progress Notes (Signed)
  Progress Note   Patient: Adam Skinner ORV:[id number] DOB: 1996-02-05 DOA: 08/27/2021     1 DOS: the patient was seen and examined on 08/29/2021   Brief hospital course: 26 year old male with past medical history of diabetes mellitus type 2 who presented to [facility] emergency department with complaints of nausea and vomiting with epigastric pain.  CT imaging of the abdomen and pelvis was performed revealing a 7 cm poorly defined hypodense liver lesion in the right lobe of the liver was an additional 1.4 cm lesion also noted.  Thought to be hepatic abscesses however metastatic disease cannot be excluded.  Assessment and Plan: * Liver abscess At least 1 liver lesion on CT imaging of the abdomen in the setting of substantial leukocytosis and thrombocytosis F/u Entamoeba histolytica antibodies, CRP 23.1, ESR 125 Cont empiric intravenous Zosyn per ID recs Per ID, if ab neg for E histolytica, then plan consult IR for drainage This am, denies abd pain or nausea  Uncontrolled diabetes mellitus with hyperglycemia, without long-term current use of insulin ([place]) Holding outpatient regimen of daily Amaryl Patient exhibiting poor control on arrival with blood sugars in the 300s with no evidence of anion gap Cont with SSI coverage Hemoglobin A1C 12.3 Started semglee 12 units. Consider 70/30 on d/c  Hyponatremia -Improving -Recheck bmet in AM     Subjective: Without abd pain or nausea  Physical Exam: Vitals:   08/29/21 0140 08/29/21 0657 08/29/21 1001 08/29/21 1407  BP: 124/80 108/69 (!) 106/59 108/67  Pulse: 85 72 70 84  Resp: $Remo'18 16 18 18  'ldALG$ Temp: 100.1 F (37.8 C) (!) 97.5 F (36.4 C) 97.9 F (36.6 C) 97.9 F (36.6 C)  TempSrc: Oral Oral Oral   SpO2: 95% 99% 100% 100%  Weight:      Height:       General exam: Conversant, in no acute distress Respiratory system: normal chest rise, clear, no audible wheezing Cardiovascular system: regular rhythm, s1-s2 Gastrointestinal  system: Nondistended, nontender, pos BS Central nervous system: No seizures, no tremors Extremities: No cyanosis, no joint deformities Skin: No rashes, no pallor Psychiatry: Affect normal // no auditory hallucinations   Data Reviewed:  Labs reviewed: Na 130, Cr 0.76, TB 0.9, WBC 10.9  Family Communication: Pt in room, family not at bedside  Disposition: Status is: Inpatient Remains inpatient appropriate because: Severity of illness  Planned Discharge Destination: Home    Author: Marylu Lund, MD 08/29/2021 2:21 PM  For on call review [email or web address].

## 2021-08-30 DIAGNOSIS — Z794 Long term (current) use of insulin: Secondary | ICD-10-CM

## 2021-08-30 LAB — COMPREHENSIVE METABOLIC PANEL
ALT: 41 U/L (ref 0–44)
AST: 32 U/L (ref 15–41)
Albumin: 2.4 g/dL — ABNORMAL LOW (ref 3.5–5.0)
Alkaline Phosphatase: 218 U/L — ABNORMAL HIGH (ref 38–126)
Anion gap: 11 (ref 5–15)
BUN: 9 mg/dL (ref 6–20)
CO2: 27 mmol/L (ref 22–32)
Calcium: 8.4 mg/dL — ABNORMAL LOW (ref 8.9–10.3)
Chloride: 92 mmol/L — ABNORMAL LOW (ref 98–111)
Creatinine, Ser: 0.81 mg/dL (ref 0.61–1.24)
GFR, Estimated: >60 mL/min (ref >60)
Glucose, Bld: 259 mg/dL — ABNORMAL HIGH (ref 70–99)
Potassium: 4.2 mmol/L (ref 3.5–5.1)
Sodium: 130 mmol/L — ABNORMAL LOW (ref 135–145)
Total Bilirubin: 0.6 mg/dL (ref 0.3–1.2)
Total Protein: 7.2 g/dL (ref 6.5–8.1)

## 2021-08-30 LAB — GLUCOSE, CAPILLARY
Glucose-Capillary: 242 mg/dL — ABNORMAL HIGH (ref 70–99)
Glucose-Capillary: 315 mg/dL — ABNORMAL HIGH (ref 70–99)
Glucose-Capillary: 365 mg/dL — ABNORMAL HIGH (ref 70–99)
Glucose-Capillary: 291 mg/dL — ABNORMAL HIGH (ref 70–99)

## 2021-08-30 MED ORDER — INSULIN GLARGINE-YFGN 100 UNIT/ML ~~LOC~~ SOLN
16.0000 [IU] | Freq: Every day | SUBCUTANEOUS | Status: DC
Start: 2021-08-31 — End: 2021-08-31
  Administered 2021-08-31: 16 [IU] via SUBCUTANEOUS
  Filled 2021-08-30: qty 0.16

## 2021-08-30 MED ORDER — INSULIN ASPART 100 UNIT/ML IJ SOLN
3.0000 [IU] | Freq: Three times a day (TID) | INTRAMUSCULAR | Status: DC
  Administered 2021-08-30 – 2021-08-31 (×2): 3 [IU] via SUBCUTANEOUS

## 2021-08-30 NOTE — Inpatient Diabetes Management (Addendum)
Inpatient Diabetes Program Recommendations  AACE/ADA: New Consensus Statement on Inpatient Glycemic Control (2015)  Target Ranges:  Prepandial:   less than 140 mg/dL      Peak postprandial:   less than 180 mg/dL (1-2 hours)      Critically ill patients:  140 - 180 mg/dL    Latest Reference Range & Units 08/29/21 08:03 08/29/21 11:56 08/29/21 16:09 08/29/21 20:59  Glucose-Capillary 70 - 99 mg/dL 710 (H)  8 units Novolog  331 (H)  11 units Novolog  12 units Semglee @1225   285 (H)  8 units Novolog  306 (H)  11 units Novolog     Latest Reference Range & Units 08/30/21 07:29  Glucose-Capillary 70 - 99 mg/dL 09/01/21 (H)  (H): Data is abnormally high    Home DM Meds: Amaryl 4 mg daily  Current Orders: Semglee 12 units Daily      Novolog Moderate Correction Scale/ SSI (0-15 units) TID AC + HS    MD- Note AM CBG 242 today.  Started Semglee 12 units daily yest at 12pm  Please consider:  1. Increase the Semglee to 16 units Daily (0.25 units/kg)  2. Start low dose Novolog Meal Coverage: Novolog 3 units TID with meals Hold if pt eats <50% meals     --Will follow patient during hospitalization--  [phone] RN, MSN, CDE Diabetes Coordinator Inpatient Glycemic Control Team Team Pager: [phone] (8a-5p)

## 2021-08-30 NOTE — Progress Notes (Signed)
Subjective: No new complaints  Antibiotics:  Anti-infectives (From admission, onward)    Start     Dose/Rate Route Frequency Ordered Stop   08/28/21 1000  piperacillin-tazobactam (ZOSYN) IVPB 3.375 g        3.375 g 12.5 mL/hr over 240 Minutes Intravenous Every 8 hours 08/28/21 0546     08/28/21 0215  piperacillin-tazobactam (ZOSYN) IVPB 3.375 g        3.375 g 100 mL/hr over 30 Minutes Intravenous  Once 08/28/21 0212 08/28/21 0333       Medications: Scheduled Meds:  insulin aspart  0-15 Units Subcutaneous TID AC & HS   insulin aspart  3 Units Subcutaneous TID WC   [START ON 08/31/2021] insulin glargine-yfgn  16 Units Subcutaneous Daily   Continuous Infusions:  sodium chloride 10 mL/hr (08/28/21 1034)   piperacillin-tazobactam (ZOSYN)  IV 3.375 g (08/30/21 0904)   PRN Meds:.sodium chloride, acetaminophen **OR** acetaminophen, oxyCODONE-acetaminophen **OR** morphine injection, ondansetron **OR** ondansetron (ZOFRAN) IV, polyethylene glycol    Objective: Weight change:   Intake/Output Summary (Last 24 hours) at 08/30/2021 1556 Last data filed at 08/30/2021 1400 Gross per 24 hour  Intake 720 ml  Output 0 ml  Net 720 ml    Blood pressure 114/72, pulse 75, temperature 98.5 F (36.9 C), temperature source Oral, resp. rate 18, height 5\' 10"  (1.778 m), weight 65.9 kg, SpO2 99 %. Temp:  [98.3 F (36.8 C)-100.5 F (38.1 C)] 98.5 F (36.9 C) (06/20 1420) Pulse Rate:  [75-100] 75 (06/20 1420) Resp:  [17-18] 18 (06/20 1420) BP: (113-119)/(67-73) 114/72 (06/20 1420) SpO2:  [98 %-99 %] 99 % (06/20 1420)  Physical Exam: Physical Exam Constitutional:      Appearance: He is well-developed.  HENT:     Head: Normocephalic and atraumatic.  Eyes:     Conjunctiva/sclera: Conjunctivae normal.  Cardiovascular:     Rate and Rhythm: Normal rate and regular rhythm.  Pulmonary:     Effort: Pulmonary effort is normal. No respiratory distress.     Breath sounds: Normal  breath sounds. No stridor. No wheezing.  Abdominal:     General: There is no distension.     Palpations: Abdomen is soft.     Tenderness: There is no abdominal tenderness.  Musculoskeletal:        General: Normal range of motion.     Cervical back: Normal range of motion and neck supple.  Skin:    General: Skin is warm and dry.     Findings: No erythema or rash.  Neurological:     General: No focal deficit present.     Mental Status: He is alert and oriented to person, place, and time.  Psychiatric:        Mood and Affect: Mood normal.        Behavior: Behavior normal.        Thought Content: Thought content normal.        Judgment: Judgment normal.      CBC:    BMET Recent Labs    08/29/21 0354 08/30/21 0439  NA 130* 130*  K 3.4* 4.2  CL 92* 92*  CO2 27 27  GLUCOSE 284* 259*  BUN 9 9  CREATININE 0.76 0.81  CALCIUM 8.4* 8.4*      Liver Panel  Recent Labs    08/29/21 0354 08/30/21 0439  PROT 7.0 7.2  ALBUMIN 2.3* 2.4*  AST 35 32  ALT 34 41  ALKPHOS 212* 218*  BILITOT  0.9 0.6        Sedimentation Rate Recent Labs    08/28/21 0812  ESRSEDRATE 125*    C-Reactive Protein Recent Labs    08/28/21 0812  CRP 23.1*     Micro Results: Recent Results (from the past 720 hour(s))  Culture, blood (routine x 2)     Status: None (Preliminary result)   Collection Time: 08/28/21  2:28 AM   Specimen: BLOOD  Result Value Ref Range Status   Specimen Description   Final    BLOOD RIGHT ANTECUBITAL Performed at [facility], [address], [place], [place] [address]    Special Requests   Final    BOTTLES DRAWN AEROBIC AND ANAEROBIC Blood Culture adequate volume Performed at [facility], [address], [place], [place] [address]    Culture   Final    NO GROWTH 2 DAYS Performed at [facility], [address], [place], [place] [address]    Report Status PENDING  Incomplete  Culture, blood (routine x 2)      Status: None (Preliminary result)   Collection Time: 08/28/21  2:28 AM   Specimen: BLOOD  Result Value Ref Range Status   Specimen Description   Final    BLOOD LEFT ANTECUBITAL Performed at [facility], [address], [place], [place] [address]    Special Requests   Final    BOTTLES DRAWN AEROBIC AND ANAEROBIC Blood Culture adequate volume Performed at [facility], [address] [place], [place] [address]    Culture   Final    NO GROWTH 2 DAYS Performed at [facility], [address] [place], [place] [address]    Report Status PENDING  Incomplete    Studies/Results: No results found.    Assessment/Plan:  INTERVAL HISTORY: Patient stable E histolytica antibodies still pending  Principal Problem:   Liver abscess Active Problems:   Uncontrolled diabetes mellitus with hyperglycemia, without long-term current use of insulin (HCC)   Epigastric pain    Adam Skinner is a 26 y.o. male with history of poorly controlled diabetes mellitus admitted with apparent liver abscess.  He has been on empiric Zosyn and Entamoeba histolytica antibodies have been sent due to concerns that this could represent an amoebic liver abscess.  IR guided aspirate has been delayed due to concerns that aspiration of an actual amoebic liver abscess could result in peritonitis per Dr. Ephriam Knuckles recommendations in my discussions with him today.  #1  Liver abscess:  I favor pyogenic abscess personally.  There are some markers to suggest this including elevated white count and elevated bilirubin diabetes mellitus.  Amoebic abscess that was also certainly possible and we are awaiting Entamoeba histolytica serologies.  When serologies are back and hopefully negative he can then undergo IR guided aspirate and material can be sent for culture to help potentially guide therapy though he has been on Zosyn for some time already.  Patient is agreeable to going  home with IV antibiotics if necessary.  I spent 836  minutes with the patient including than 50% of the time in face to face counseling of the patient using iPad telephonic Spanish translator going over the differential of pyogenic versus amoebic abscess and there are different management, personally reviewing CT abdomen pelvis MRI of the pelvis updated culture data along with review of medical records in preparation for the visit and during the visit and in coordination of his care.  LOS: 2 days   Acey Lav 08/30/2021, 3:56 PM

## 2021-08-30 NOTE — Progress Notes (Signed)
  Progress Note   Patient: Adam Skinner AJO:[id number] DOB: 1995/07/29 DOA: 08/27/2021     2 DOS: the patient was seen and examined on 08/30/2021   Brief hospital course: 26 year old male with past medical history of diabetes mellitus type 2 who presented to [facility] emergency department with complaints of nausea and vomiting with epigastric pain.  CT imaging of the abdomen and pelvis was performed revealing a 7 cm poorly defined hypodense liver lesion in the right lobe of the liver was an additional 1.4 cm lesion also noted.  Thought to be hepatic abscesses however metastatic disease cannot be excluded.  Assessment and Plan: * Liver abscess At least 1 liver lesion on CT imaging of the abdomen in the setting of substantial leukocytosis and thrombocytosis F/u Entamoeba histolytica antibodies, CRP 23.1, ESR 125 Cont empiric intravenous Zosyn per ID recs Per ID, if ab neg for E histolytica, then plan consult IR for drainage Remains without abd pain or nausea. E. Histolytica labs remain pending  Uncontrolled diabetes mellitus with hyperglycemia, without long-term current use of insulin (HCC) Holding outpatient regimen of daily Amaryl Hemoglobin A1C 12.3 Started semglee 12 units. Increase to 16 units with aspart 3unit tid Consider transition to 70/30 at time of d/c  Hyponatremia -stable -Recheck bmet in AM     Subjective: Denies abd pain or nausea this AM  Physical Exam: Vitals:   08/29/21 1816 08/29/21 1908 08/29/21 2116 08/30/21 0514  BP: 119/73  115/67 113/73  Pulse: 100  90 93  Resp: _0 Temp: 98.3 F (36.8 C) 98.6 F (37 C) 100.1 F (37.8 C) (!) 100.5 F (38.1 C)  TempSrc:  Oral Oral Oral  SpO2: 99%  98% 98%  Weight:      Height:       General exam: Awake, laying in bed, in nad Respiratory system: Normal respiratory effort, no wheezing Cardiovascular system: regular rate, s1, s2 Gastrointestinal system: Soft, nondistended, positive BS Central  nervous system: CN2-12 grossly intact, strength intact Extremities: Perfused, no clubbing Skin: Normal skin turgor, no notable skin lesions seen Psychiatry: Mood normal // no visual hallucinations   Data Reviewed:  Labs reviewed: Na 130, Cr 0.81, TB 0.6  Family Communication: Pt in room, family not at bedside  Disposition: Status is: Inpatient Remains inpatient appropriate because: Severity of illness  Planned Discharge Destination: Home    Author: Marylu Lund, MD 08/30/2021 1:29 PM  For on call review [email or web address].

## 2021-08-31 DIAGNOSIS — D649 Anemia, unspecified: Secondary | ICD-10-CM

## 2021-08-31 DIAGNOSIS — D72825 Bandemia: Secondary | ICD-10-CM

## 2021-08-31 DIAGNOSIS — E871 Hypo-osmolality and hyponatremia: Secondary | ICD-10-CM

## 2021-08-31 DIAGNOSIS — Z789 Other specified health status: Secondary | ICD-10-CM

## 2021-08-31 LAB — COMPREHENSIVE METABOLIC PANEL
ALT: 36 U/L (ref 0–44)
AST: 25 U/L (ref 15–41)
Albumin: 2.4 g/dL — ABNORMAL LOW (ref 3.5–5.0)
Alkaline Phosphatase: 202 U/L — ABNORMAL HIGH (ref 38–126)
Anion gap: 12 (ref 5–15)
BUN: 9 mg/dL (ref 6–20)
CO2: 25 mmol/L (ref 22–32)
Calcium: 8.4 mg/dL — ABNORMAL LOW (ref 8.9–10.3)
Chloride: 93 mmol/L — ABNORMAL LOW (ref 98–111)
Creatinine, Ser: 0.65 mg/dL (ref 0.61–1.24)
GFR, Estimated: >60 mL/min (ref >60)
Glucose, Bld: 245 mg/dL — ABNORMAL HIGH (ref 70–99)
Potassium: 3.9 mmol/L (ref 3.5–5.1)
Sodium: 130 mmol/L — ABNORMAL LOW (ref 135–145)
Total Bilirubin: 0.6 mg/dL (ref 0.3–1.2)
Total Protein: 7.5 g/dL (ref 6.5–8.1)

## 2021-08-31 LAB — GLUCOSE, CAPILLARY
Glucose-Capillary: 248 mg/dL — ABNORMAL HIGH (ref 70–99)
Glucose-Capillary: 289 mg/dL — ABNORMAL HIGH (ref 70–99)
Glucose-Capillary: 173 mg/dL — ABNORMAL HIGH (ref 70–99)
Glucose-Capillary: 278 mg/dL — ABNORMAL HIGH (ref 70–99)

## 2021-08-31 LAB — CBC
HCT: 36 % — ABNORMAL LOW (ref 39.0–52.0)
Hemoglobin: 11.7 g/dL — ABNORMAL LOW (ref 13.0–17.0)
MCH: 26.8 pg (ref 26.0–34.0)
MCHC: 32.5 g/dL (ref 30.0–36.0)
MCV: 82.6 fL (ref 80.0–100.0)
Platelets: 595 10*3/uL — ABNORMAL HIGH (ref 150–400)
RBC: 4.36 MIL/uL (ref 4.22–5.81)
RDW: 11.9 % (ref 11.5–15.5)
WBC: 10.6 10*3/uL — ABNORMAL HIGH (ref 4.0–10.5)
nRBC: 0 % (ref 0.0–0.2)

## 2021-08-31 MED ORDER — INSULIN GLARGINE-YFGN 100 UNIT/ML ~~LOC~~ SOLN
20.0000 [IU] | Freq: Every day | SUBCUTANEOUS | Status: DC
  Filled 2021-08-31: qty 0.2

## 2021-08-31 MED ORDER — INSULIN ASPART 100 UNIT/ML IJ SOLN
5.0000 [IU] | Freq: Three times a day (TID) | INTRAMUSCULAR | Status: DC
  Administered 2021-08-31 (×2): 5 [IU] via SUBCUTANEOUS

## 2021-08-31 NOTE — Progress Notes (Signed)
PROGRESS NOTE  Adam Skinner SWN:[id number] DOB: Feb 02, 1996   PCP: Pcp, No  Patient is from: Home  DOA: 08/27/2021 LOS: 3  Chief complaints Chief Complaint  Patient presents with   Abdominal Pain     Brief Narrative / Interim history: 26 year old Spanish-speaking male with PMH of DM-2 presenting with nausea, vomiting and epigastric abdominal pain and admitted for liver abscess.  Patient moved from [place] to [place] about 5 months ago.  CT abdomen and pelvis showed 7 cm poorly defined hypodense liver lesion in the right lobe of the liver and an additional 1.4 cm lesion, and prominent prostate gland and seminal vesicles for age.  MRI showed a 2.2 cm complex cystic lesion of the right hepatic lobe highly suspicious for liver abscess, and focal fatty infiltration in the left hepatic lobe.  Patient was started on IV Zosyn.  ID consulted and following.  Subjective: Seen and examined earlier this morning with the help of phone interpreter with ID number [id number].  No major events overnight of this morning.  No complaints.  He denies nausea, vomiting, abdominal pain or UTI symptoms.  Denies drinking stream water or well water  Objective: Vitals:   08/30/21 1420 08/30/21 2145 08/31/21 0505 08/31/21 1305  BP: 114/72 111/67 110/66 109/71  Pulse: 75 85 83 75  Resp: _0 Temp: 98.5 F (36.9 C) 98.2 F (36.8 C) 98.5 F (36.9 C) 98.6 F (37 C)  TempSrc: Oral Oral Oral Oral  SpO2: 99% 100% 100% 99%  Weight:      Height:        Examination:  GENERAL: No apparent distress.  Nontoxic. HEENT: MMM.  Vision and hearing grossly intact.  NECK: Supple.  No apparent JVD.  RESP:  No IWOB.  Fair aeration bilaterally. CVS:  RRR. Heart sounds normal.  ABD/GI/GU: BS+. Abd soft, NTND.  MSK/EXT:  Moves extremities. No apparent deformity. No edema.  SKIN: no apparent skin lesion or wound NEURO: Awake, alert and oriented appropriately.  No apparent focal neuro deficit. PSYCH: Calm. Normal  affect.   Procedures:  None  Microbiology summarized: Blood cultures NGTD  Assessment and plan: Principal Problem:   Liver abscess Active Problems:   Uncontrolled diabetes mellitus with hyperglycemia, without long-term current use of insulin (HCC)   Epigastric pain  Liver abscess: MRI showed 8.2 x 5.5 cm complex cystic lesion in right hepatic lobe suspicious for abscess.  Markedly elevated inflammatory markers with CRP of 23.1 and ESR of 123.  HIV nonreactive.  Recently moved from [place] to [place] about 5 months ago.  Denies drinking stream water or well water. -Continue IV Zosyn -ID recommends IR drainage of liver abscess once Entamoeba histolytica antibody test negative. -Check urine for GC/CT  Uncontrolled NIDDM-2 with hyperglycemia: A1c 12.3%. Recent Labs  Lab 08/30/21 1145 08/30/21 1702 08/30/21 2056 08/31/21 0734 08/31/21 1132  GLUCAP 315* 365* 291* 248* 289*  -Continue SSI-moderate -Increase NovoLog from 3 to 5 units 3 times daily with meals -Increase Semglee from 16 to 20 units -Further adjustment as appropriate.  Hyponatremia: Partly due to hyperglycemia.  Stable. -Continue monitoring  Leukocytosis/thrombocytosis: Likely due to infection.  Improved.  Normocytic anemia: Relatively stable.  Continue monitoring  Language barrier affecting communication -Phone interpreter utilized  Body mass index is 20.85 kg/m.          DVT prophylaxis:  Place and maintain sequential compression device Start: 08/31/21 1450  Code Status: Full code Family Communication: None Level of care: Med-Surg Status is:  Inpatient Remains inpatient appropriate because: Liver abscess requiring IV antibiotics and further evaluation   Final disposition: Likely home once medically cleared Consultants:  Infectious disease  Sch Meds:  Scheduled Meds:  insulin aspart  0-15 Units Subcutaneous TID AC & HS   insulin aspart  5 Units Subcutaneous TID WC   [START ON 09/01/2021] insulin  glargine-yfgn  20 Units Subcutaneous Daily   Continuous Infusions:  sodium chloride 10 mL/hr (08/28/21 1034)   piperacillin-tazobactam (ZOSYN)  IV 3.375 g (08/31/21 0925)   PRN Meds:.sodium chloride, acetaminophen **OR** acetaminophen, oxyCODONE-acetaminophen **OR** morphine injection, ondansetron **OR** ondansetron (ZOFRAN) IV, polyethylene glycol  Antimicrobials: Anti-infectives (From admission, onward)    Start     Dose/Rate Route Frequency Ordered Stop   08/28/21 1000  piperacillin-tazobactam (ZOSYN) IVPB 3.375 g        3.375 g 12.5 mL/hr over 240 Minutes Intravenous Every 8 hours 08/28/21 0546     08/28/21 0215  piperacillin-tazobactam (ZOSYN) IVPB 3.375 g        3.375 g 100 mL/hr over 30 Minutes Intravenous  Once 08/28/21 0212 08/28/21 0333        I have personally reviewed the following labs and images: CBC: Recent Labs  Lab 08/27/21 2341 08/29/21 0354 08/31/21 0510  WBC 19.0* 10.9* 10.6*  NEUTROABS  --  10.0*  --   HGB 13.5 11.5* 11.7*  HCT 40.7 35.0* 36.0*  MCV 82.2 82.2 82.6  PLT 864* 603* 595*   BMP &GFR Recent Labs  Lab 08/27/21 2341 08/29/21 0354 08/30/21 0439 08/31/21 0510  NA 129* 130* 130* 130*  K 4.1 3.4* 4.2 3.9  CL 88* 92* 92* 93*  CO2 _0 GLUCOSE 306* 284* 259* 245*  BUN _1 CREATININE 0.81 0.76 0.81 0.65  CALCIUM 9.2 8.4* 8.4* 8.4*  MG  --  1.8  --   --    Estimated Creatinine Clearance: 131.6 mL/min (by C-G formula based on SCr of 0.65 mg/dL). Liver & Pancreas: Recent Labs  Lab 08/27/21 2341 08/29/21 0354 08/30/21 0439 08/31/21 0510  AST 28 35 32 25  ALT 39 34 41 36  ALKPHOS 249* 212* 218* 202*  BILITOT 1.3* 0.9 0.6 0.6  PROT 9.0* 7.0 7.2 7.5  ALBUMIN 2.9* 2.3* 2.4* 2.4*   Recent Labs  Lab 08/27/21 2341  LIPASE 22   No results for input(s): "AMMONIA" in the last 168 hours. Diabetic: No results for input(s): "HGBA1C" in the last 72 hours. Recent Labs  Lab 08/30/21 1145 08/30/21 1702 08/30/21 2056  08/31/21 0734 08/31/21 1132  GLUCAP 315* 365* 291* 248* 289*   Cardiac Enzymes: No results for input(s): "CKTOTAL", "CKMB", "CKMBINDEX", "TROPONINI" in the last 168 hours. No results for input(s): "PROBNP" in the last 8760 hours. Coagulation Profile: No results for input(s): "INR", "PROTIME" in the last 168 hours. Thyroid Function Tests: No results for input(s): "TSH", "T4TOTAL", "FREET4", "T3FREE", "THYROIDAB" in the last 72 hours. Lipid Profile: No results for input(s): "CHOL", "HDL", "LDLCALC", "TRIG", "CHOLHDL", "LDLDIRECT" in the last 72 hours. Anemia Panel: No results for input(s): "VITAMINB12", "FOLATE", "FERRITIN", "TIBC", "IRON", "RETICCTPCT" in the last 72 hours. Urine analysis:    Component Value Date/Time   COLORURINE YELLOW 08/27/2021 2341   APPEARANCEUR CLEAR 08/27/2021 2341   LABSPEC 1.028 08/27/2021 2341   PHURINE 6.0 08/27/2021 2341   GLUCOSEU >=500 (A) 08/27/2021 2341   HGBUR MODERATE (A) 08/27/2021 2341   BILIRUBINUR NEGATIVE 08/27/2021 2341   KETONESUR 80 (A) 08/27/2021 2341  PROTEINUR 100 (A) 08/27/2021 2341   NITRITE NEGATIVE 08/27/2021 2341   LEUKOCYTESUR NEGATIVE 08/27/2021 2341   Sepsis Labs: Invalid input(s): "PROCALCITONIN", "LACTICIDVEN"  Microbiology: Recent Results (from the past 240 hour(s))  Culture, blood (routine x 2)     Status: None (Preliminary result)   Collection Time: 08/28/21  2:28 AM   Specimen: BLOOD  Result Value Ref Range Status   Specimen Description   Final    BLOOD RIGHT ANTECUBITAL Performed at [facility] [address], [place], [place] [address]    Special Requests   Final    BOTTLES DRAWN AEROBIC AND ANAEROBIC Blood Culture adequate volume Performed at [facility] [address] [place], [place] [address]    Culture   Final    NO GROWTH 3 DAYS Performed at [facility], [place] [address] [place], [place] [address]    Report Status PENDING  Incomplete  Culture,  blood (routine x 2)     Status: None (Preliminary result)   Collection Time: 08/28/21  2:28 AM   Specimen: BLOOD  Result Value Ref Range Status   Specimen Description   Final    BLOOD LEFT ANTECUBITAL Performed at [facility] [address] [place], [place] [address]    Special Requests   Final    BOTTLES DRAWN AEROBIC AND ANAEROBIC Blood Culture adequate volume Performed at [facility] [address], [place], [place] [address]    Culture   Final    NO GROWTH 3 DAYS Performed at [facility], [place] [address] [place], [place] [address]    Report Status PENDING  Incomplete    Radiology Studies: No results found.    Taye T. Saxman  If 7PM-7AM, please contact night-coverage [email or web address] 08/31/2021, 2:49 PM

## 2021-08-31 NOTE — Inpatient Diabetes Management (Signed)
Inpatient Diabetes Program Recommendations  AACE/ADA: New Consensus Statement on Inpatient Glycemic Control (2015)  Target Ranges:  Prepandial:   less than 140 mg/dL      Peak postprandial:   less than 180 mg/dL (1-2 hours)      Critically ill patients:  140 - 180 mg/dL   Lab Results  Component Value Date   GLUCAP 248 (H) 08/31/2021   HGBA1C 12.3 (H) 08/28/2021    Review of Glycemic Control  Latest Reference Range & Units 08/30/21 11:45 08/30/21 17:02 08/30/21 20:56 08/31/21 07:34  Glucose-Capillary 70 - 99 mg/dL 732 (H) 202 (H) 542 (H) 248 (H)  (H): Data is abnormally high Home DM Meds: Amaryl 4 mg daily   Current Orders: Semglee 16 units Daily                            Novolog Moderate Correction Scale/ SSI (0-15 units) TID AC + HS  Novolog 3 units TID      Consider:   1. Increase the Semglee to 24 units Daily    2. Start low dose Novolog Meal Coverage: Novolog 8 units TID with meals Hold if pt eats <50% meals  Thanks, Lujean Rave, MSN, RNC-OB Diabetes Coordinator [phone] (8a-5p)

## 2021-09-01 ENCOUNTER — Encounter (HOSPITAL_COMMUNITY): Payer: Self-pay | Admitting: Internal Medicine

## 2021-09-01 LAB — CBC WITH DIFFERENTIAL/PLATELET
Abs Immature Granulocytes: 0.12 10*3/uL — ABNORMAL HIGH (ref 0.00–0.07)
Basophils Absolute: 0 10*3/uL (ref 0.0–0.1)
Basophils Relative: 0 %
Eosinophils Absolute: 0.1 10*3/uL (ref 0.0–0.5)
Eosinophils Relative: 1 %
HCT: 35.4 % — ABNORMAL LOW (ref 39.0–52.0)
Hemoglobin: 11.5 g/dL — ABNORMAL LOW (ref 13.0–17.0)
Immature Granulocytes: 1 %
Lymphocytes Relative: 28 %
Lymphs Abs: 2.9 10*3/uL (ref 0.7–4.0)
MCH: 26.7 pg (ref 26.0–34.0)
MCHC: 32.5 g/dL (ref 30.0–36.0)
MCV: 82.3 fL (ref 80.0–100.0)
Monocytes Absolute: 1.2 10*3/uL — ABNORMAL HIGH (ref 0.1–1.0)
Monocytes Relative: 11 %
Neutro Abs: 6.1 10*3/uL (ref 1.7–7.7)
Neutrophils Relative %: 59 %
Platelets: 629 10*3/uL — ABNORMAL HIGH (ref 150–400)
RBC: 4.3 MIL/uL (ref 4.22–5.81)
RDW: 12 % (ref 11.5–15.5)
WBC: 10.5 10*3/uL (ref 4.0–10.5)
nRBC: 0 % (ref 0.0–0.2)

## 2021-09-01 LAB — RENAL FUNCTION PANEL
Albumin: 2.4 g/dL — ABNORMAL LOW (ref 3.5–5.0)
Anion gap: 8 (ref 5–15)
BUN: 8 mg/dL (ref 6–20)
CO2: 28 mmol/L (ref 22–32)
Calcium: 8.8 mg/dL — ABNORMAL LOW (ref 8.9–10.3)
Chloride: 98 mmol/L (ref 98–111)
Creatinine, Ser: 0.69 mg/dL (ref 0.61–1.24)
GFR, Estimated: >60 mL/min (ref >60)
Glucose, Bld: 255 mg/dL — ABNORMAL HIGH (ref 70–99)
Phosphorus: 3.3 mg/dL (ref 2.5–4.6)
Potassium: 3.8 mmol/L (ref 3.5–5.1)
Sodium: 134 mmol/L — ABNORMAL LOW (ref 135–145)

## 2021-09-01 LAB — GLUCOSE, CAPILLARY
Glucose-Capillary: 233 mg/dL — ABNORMAL HIGH (ref 70–99)
Glucose-Capillary: 326 mg/dL — ABNORMAL HIGH (ref 70–99)
Glucose-Capillary: 152 mg/dL — ABNORMAL HIGH (ref 70–99)
Glucose-Capillary: 171 mg/dL — ABNORMAL HIGH (ref 70–99)

## 2021-09-01 LAB — E. HISTOLYTICA ANTIBODY (AMOEBA AB): E histolytica Ab: NEGATIVE

## 2021-09-01 LAB — MAGNESIUM: Magnesium: 2.1 mg/dL (ref 1.7–2.4)

## 2021-09-01 MED ORDER — INSULIN GLARGINE-YFGN 100 UNIT/ML ~~LOC~~ SOLN
15.0000 [IU] | Freq: Two times a day (BID) | SUBCUTANEOUS | Status: DC
  Administered 2021-09-01 – 2021-09-02 (×3): 15 [IU] via SUBCUTANEOUS
  Filled 2021-09-01 (×4): qty 0.15

## 2021-09-01 MED ORDER — INSULIN ASPART 100 UNIT/ML IJ SOLN
7.0000 [IU] | Freq: Three times a day (TID) | INTRAMUSCULAR | Status: DC
  Administered 2021-09-01 – 2021-09-07 (×16): 7 [IU] via SUBCUTANEOUS

## 2021-09-01 MED ORDER — INSULIN GLARGINE-YFGN 100 UNIT/ML ~~LOC~~ SOLN
22.0000 [IU] | Freq: Every day | SUBCUTANEOUS | Status: DC
  Administered 2021-09-01: 22 [IU] via SUBCUTANEOUS
  Filled 2021-09-01: qty 0.22

## 2021-09-01 NOTE — Progress Notes (Signed)
PROGRESS NOTE  Adam Skinner PNT:[id number] DOB: [birth year]   PCP: Pcp, No  Patient is from: Home  DOA: 08/27/2021 LOS: 4  Chief complaints Chief Complaint  Patient presents with   Abdominal Pain     Brief Narrative / Interim history: 26 year old Spanish-speaking male with PMH of DM-2 presenting with nausea, vomiting and epigastric abdominal pain and admitted for liver abscess.  [place] moved from [place] to [place] about 5 months ago.  CT abdomen and pelvis showed 7 cm poorly defined hypodense liver lesion in the right lobe of the liver and an additional 1.4 cm lesion, and prominent prostate gland and seminal vesicles for age.  MRI showed a 2.2 cm complex cystic lesion of the right hepatic lobe highly suspicious for liver abscess, and focal fatty infiltration in the left hepatic lobe.  Patient was started on IV Zosyn.  ID consulted and following. Plan is for IR drain of liver abscess if Entamoeba histolytica antibody test positive.  Subjective: Seen and examined earlier this morning with the help of video interpreter with ID number [id number].  No major events overnight of this morning.  No complaints but eager to go home.  Objective: Vitals:   08/31/21 1305 08/31/21 2135 09/01/21 0435 09/01/21 1250  BP: 109/71 111/69 101/66 118/72  Pulse: 75 76 82 69  Resp: 18 18 16 20   Temp: 98.6 F (37 C) 99.3 F (37.4 C) 98.5 F (36.9 C) 98.2 F (36.8 C)  TempSrc: Oral Oral Oral   SpO2: 99% 99% 98% 100%  Weight:      Height:        Examination: GENERAL: No apparent distress.  Nontoxic. HEENT: MMM.  Vision and hearing grossly intact.  NECK: Supple.  No apparent JVD.  RESP:  No IWOB.  Fair aeration bilaterally. CVS:  RRR. Heart sounds normal.  ABD/GI/GU: BS+. Abd soft, NTND.  MSK/EXT:  Moves extremities. No apparent deformity. No edema.  SKIN: no apparent skin lesion or wound NEURO: Awake and alert. Oriented appropriately.  No apparent focal neuro deficit. PSYCH: Calm. Normal affect.    Procedures:  None  Microbiology summarized: Blood cultures NGTD  Assessment and plan: Principal Problem:   Liver abscess Active Problems:   Uncontrolled diabetes mellitus with hyperglycemia, without long-term current use of insulin (HCC)   Epigastric pain   Hyponatremia   Bandemia   Normocytic anemia   Language barrier  Liver abscess: MRI showed 8.2 x 5.5 cm complex cystic lesion in right hepatic lobe suspicious for abscess.  Markedly elevated inflammatory markers with CRP of 23.1 and ESR of 123.  HIV nonreactive.  Recently moved from to [place] about 5 months ago.  Denies drinking stream water or well water. -Continue IV Zosyn -ID recommends IR drainage of liver abscess once Entamoeba histolytica antibody test negative. -Check urine for GC/CT  Uncontrolled NIDDM-2 with hyperglycemia: A1c 12.3%. Recent Labs  Lab 08/31/21 1132 08/31/21 1625 08/31/21 2132 09/01/21 0728 09/01/21 1146  GLUCAP 289* 173* 278* 233* 326*  -Continue SSI-moderate -Increase NovoLog from 5 to 7 units 3 times daily -Received Semglee 22 units earlier this morning.  Change to 15u twice daily starting tonight -Further adjustment as appropriate.  Hyponatremia: Corrects to normal for hyperglycemia. -Continue monitoring  Leukocytosis/thrombocytosis: Leukocytosis resolved.  Normocytic anemia: Relatively stable.  Continue monitoring  Language barrier affecting communication -Video interpreter utilized.  Body mass index is 20.85 kg/m.          DVT prophylaxis:  Place and maintain sequential compression device Start: 08/31/21  1450  Code Status: Full code Family Communication: None Level of care: Med-Surg Status is: Inpatient Remains inpatient appropriate because: Liver abscess requiring IV antibiotics and further evaluation   Final disposition: Likely home once medically cleared Consultants:  Infectious disease  Sch Meds:  Scheduled Meds:  insulin aspart  0-15 Units  Subcutaneous TID AC & HS   insulin aspart  7 Units Subcutaneous TID WC   insulin glargine-yfgn  22 Units Subcutaneous Daily   Continuous Infusions:  sodium chloride 10 mL/hr (08/28/21 1034)   piperacillin-tazobactam (ZOSYN)  IV 3.375 g (09/01/21 0849)   PRN Meds:.sodium chloride, acetaminophen **OR** acetaminophen, oxyCODONE-acetaminophen **OR** morphine injection, ondansetron **OR** ondansetron (ZOFRAN) IV, polyethylene glycol  Antimicrobials: Anti-infectives (From admission, onward)    Start     Dose/Rate Route Frequency Ordered Stop   08/28/21 1000  piperacillin-tazobactam (ZOSYN) IVPB 3.375 g        3.375 g 12.5 mL/hr over 240 Minutes Intravenous Every 8 hours 08/28/21 0546     08/28/21 0215  piperacillin-tazobactam (ZOSYN) IVPB 3.375 g        3.375 g 100 mL/hr over 30 Minutes Intravenous  Once 08/28/21 0212 08/28/21 0333        I have personally reviewed the following labs and images: CBC: Recent Labs  Lab 08/27/21 2341 08/29/21 0354 08/31/21 0510 09/01/21 0432  WBC 19.0* 10.9* 10.6* 10.5  NEUTROABS  --  10.0*  --  6.1  HGB 13.5 11.5* 11.7* 11.5*  HCT 40.7 35.0* 36.0* 35.4*  MCV 82.2 82.2 82.6 82.3  PLT 864* 603* 595* 629*   BMP &GFR Recent Labs  Lab 08/27/21 2341 08/29/21 0354 08/30/21 0439 08/31/21 0510 09/01/21 0432  NA 129* 130* 130* 130* 134*  K 4.1 3.4* 4.2 3.9 3.8  CL 88* 92* 92* 93* 98  CO2 $Re'26 27 27 25 28  'lDR$ GLUCOSE 306* 284* 259* 245* 255*  BUN $Re'10 9 9 9 8  'hMy$ CREATININE 0.81 0.76 0.81 0.65 0.69  CALCIUM 9.2 8.4* 8.4* 8.4* 8.8*  MG  --  1.8  --   --  2.1  PHOS  --   --   --   --  3.3   Estimated Creatinine Clearance: 131.6 mL/min (by C-G formula based on SCr of 0.69 mg/dL). Liver & Pancreas: Recent Labs  Lab 08/27/21 2341 08/29/21 0354 08/30/21 0439 08/31/21 0510 09/01/21 0432  AST 28 35 32 25  --   ALT 39 34 41 36  --   ALKPHOS 249* 212* 218* 202*  --   BILITOT 1.3* 0.9 0.6 0.6  --   PROT 9.0* 7.0 7.2 7.5  --   ALBUMIN 2.9* 2.3* 2.4*  2.4* 2.4*   Recent Labs  Lab 08/27/21 2341  LIPASE 22   No results for input(s): "AMMONIA" in the last 168 hours. Diabetic: No results for input(s): "HGBA1C" in the last 72 hours. Recent Labs  Lab 08/31/21 1132 08/31/21 1625 08/31/21 2132 09/01/21 0728 09/01/21 1146  GLUCAP 289* 173* 278* 233* 326*   Cardiac Enzymes: No results for input(s): "CKTOTAL", "CKMB", "CKMBINDEX", "TROPONINI" in the last 168 hours. No results for input(s): "PROBNP" in the last 8760 hours. Coagulation Profile: No results for input(s): "INR", "PROTIME" in the last 168 hours. Thyroid Function Tests: No results for input(s): "TSH", "T4TOTAL", "FREET4", "T3FREE", "THYROIDAB" in the last 72 hours. Lipid Profile: No results for input(s): "CHOL", "HDL", "LDLCALC", "TRIG", "CHOLHDL", "LDLDIRECT" in the last 72 hours. Anemia Panel: No results for input(s): "VITAMINB12", "FOLATE", "FERRITIN", "TIBC", "IRON", "RETICCTPCT" in the  last 72 hours. Urine analysis:    Component Value Date/Time   COLORURINE YELLOW 08/27/2021 2341   APPEARANCEUR CLEAR 08/27/2021 2341   LABSPEC 1.028 08/27/2021 2341   PHURINE 6.0 08/27/2021 2341   GLUCOSEU >=500 (A) 08/27/2021 2341   HGBUR MODERATE (A) 08/27/2021 2341   BILIRUBINUR NEGATIVE 08/27/2021 2341   KETONESUR 80 (A) 08/27/2021 2341   PROTEINUR 100 (A) 08/27/2021 2341   NITRITE NEGATIVE 08/27/2021 2341   LEUKOCYTESUR NEGATIVE 08/27/2021 2341   Sepsis Labs: Invalid input(s): "PROCALCITONIN", "LACTICIDVEN"  Microbiology: Recent Results (from the past 240 hour(s))  Culture, blood (routine x 2)     Status: None (Preliminary result)   Collection Time: 08/28/21  2:28 AM   Specimen: BLOOD  Result Value Ref Range Status   Specimen Description   Final    BLOOD RIGHT ANTECUBITAL Performed at [facility] [address] [place], [place] [address]    Special Requests   Final    BOTTLES DRAWN AEROBIC AND ANAEROBIC Blood Culture adequate volume Performed  at [facility] [address], [place], [place] [address]    Culture   Final    NO GROWTH 4 DAYS Performed at [facility], [place] [address] [place], [place] [address]    Report Status PENDING  Incomplete  Culture, blood (routine x 2)     Status: None (Preliminary result)   Collection Time: 08/28/21  2:28 AM   Specimen: BLOOD  Result Value Ref Range Status   Specimen Description   Final    BLOOD LEFT ANTECUBITAL Performed at [facility] [address], [place], [place] [address]    Special Requests   Final    BOTTLES DRAWN AEROBIC AND ANAEROBIC Blood Culture adequate volume Performed at [facility] [address], [place], [place] [address]    Culture   Final    NO GROWTH 4 DAYS Performed at [facility], [place] [address], [place], [place] [address]    Report Status PENDING  Incomplete    Radiology Studies: No results found.    Kalman Nylen T. [place]  If 7PM-7AM, please contact night-coverage [email or web address] 09/01/2021, 1:25 PM

## 2021-09-01 NOTE — TOC Initial Note (Signed)
Transition of Care [facility]) - Initial/Assessment Note    Patient Details  Name: Adam Skinner MRN: [id number] Date of Birth: 1995/10/08  Transition of Care [facility]) CM/SW Contact:    Lennart Pall, LCSW Phone Number: 09/01/2021, 3:02 PM  Clinical Narrative:                 With interpreter Ipad, met with pt today to introduce TOC role with dc planning needs.  Specific TOC referral placed to assist with establishing PCP coverage and possible medication assistance.   Pt confirms that he lives with several family members locally and has access to transportation assist.  He does not have a PCP and is uninsured.  He is agreeable to assist with referral to local health clinic.  Have secured a new patient appointment at [facility] on 10/03/21 @ 9:30am and information on AVS.  Pt may need assist with medication - will continue to follow.  Expected Discharge Plan: Home/Self Care Barriers to Discharge: Continued Medical Work up, Inadequate or no insurance   Patient Goals and CMS Choice Patient states their goals for this hospitalization and ongoing recovery are:: return home      Expected Discharge Plan and Services Expected Discharge Plan: Home/Self Care In-house Referral: Clinical Social Work Discharge Planning Services: Medication Assistance   Living arrangements for the past 2 months: Single Family Home                 DME Arranged: N/A DME Agency: NA                  Prior Living Arrangements/Services Living arrangements for the past 2 months: Single Family Home Lives with:: Relatives Patient language and need for interpreter reviewed:: Yes Do you feel safe going back to the place where you live?: Yes      Need for Family Participation in Patient Care: No (Comment) Care giver support system in place?: Yes (comment)   Criminal Activity/Legal Involvement Pertinent to Current Situation/Hospitalization: No - Comment as needed  Activities of Daily Living Home  Assistive Devices/Equipment: Eyeglasses ADL Screening (condition at time of admission) Patient's cognitive ability adequate to safely complete daily activities?: Yes Is the patient deaf or have difficulty hearing?: No Does the patient have difficulty seeing, even when wearing glasses/contacts?: No Does the patient have difficulty concentrating, remembering, or making decisions?: No Patient able to express need for assistance with ADLs?: Yes Does the patient have difficulty dressing or bathing?: No Independently performs ADLs?: Yes (appropriate for developmental age) Does the patient have difficulty walking or climbing stairs?: No Weakness of Legs: None Weakness of Arms/Hands: None  Permission Sought/Granted                  Emotional Assessment Appearance:: Appears stated age Attitude/Demeanor/Rapport: Gracious Affect (typically observed): Accepting Orientation: : Oriented to Self, Oriented to Place, Oriented to  Time, Oriented to Situation Alcohol / Substance Use: Not Applicable Psych Involvement: No (comment)  Admission diagnosis:  Liver abscess [K75.0] Epigastric pain [R10.13] Patient Active Problem List   Diagnosis Date Noted   Hyponatremia 08/31/2021   Bandemia 08/31/2021   Normocytic anemia 08/31/2021   Language barrier 08/31/2021   Epigastric pain    Liver abscess 08/28/2021   Uncontrolled diabetes mellitus with hyperglycemia, without long-term current use of insulin ([place]) 08/28/2021   PCP:  Pcp, No Pharmacy:   [facility] [place] [address] Phone: [phone] Fax: [phone]     Social Determinants of  Health (SDOH) Interventions    Readmission Risk Interventions    09/01/2021    2:59 PM  Readmission Risk Prevention Plan  Post Dischage Appt Complete  Medication Screening Complete  Transportation Screening Complete

## 2021-09-01 NOTE — Plan of Care (Signed)

## 2021-09-01 NOTE — Consult Note (Signed)
Chief Complaint: Patient was seen in consultation today for evaluation for image-guided hepatic abscess drain placement at the request of Dr Paulette [place] Chief Complaint  Patient presents with   Abdominal Pain     Supervising Physician: Marliss Coots  Patient Status: [facility] - In-pt  History of Present Illness: Adel Burch is a 26 y.o. male with PMH of T2DM who was admitted to [facility] on 08/28/21 with significant abdominal pain, leukocytosis of 19, and thrombocytosis of 864. CT Abdomen and Pelvis revealed a 7cm poorly defined liver lesion in the right lobe of the liver with an additional 1.4cm lesion noted. Suspicion for hepatic abscesses, but metastatic disease could not be excluded. Patient has been followed by hospitalist team and infectious disease, and IR was consulted on 6/22 to evaluate for a possible image-guided hepatic abscess drain placement.  Past Medical History:  Diagnosis Date   Diabetes (HCC)     History reviewed. No pertinent surgical history.  Allergies: Patient has no known allergies.  Medications: Prior to Admission medications   Medication Sig Start Date End Date Taking? Authorizing Provider  glimepiride (AMARYL) 4 MG tablet Take 4 mg by mouth daily with breakfast.   Yes [provider]     Family History  Problem Relation Age of Onset   Heart disease Neg Hx     Social History   Socioeconomic History   Marital status: Married    Spouse name: Not on file   Number of children: Not on file   Years of education: Not on file   Highest education level: Not on file  Occupational History   Not on file  Tobacco Use   Smoking status: Never   Smokeless tobacco: Never  Substance and Sexual Activity   Alcohol use: Not Currently   Drug use: Never   Sexual activity: Not on file  Other Topics Concern   Not on file  Social History Narrative   Not on file   Social Determinants of Health   Financial Resource Strain: Not on file   Food Insecurity: Not on file  Transportation Needs: Not on file  Physical Activity: Not on file  Stress: Not on file  Social Connections: Not on file     Review of Systems: A 12 point ROS discussed and pertinent positives are indicated in the HPI above.  All other systems are negative.  Review of Systems  Constitutional:  Negative for chills and fever.  Respiratory:  Negative for chest tightness and shortness of breath.   Cardiovascular:  Negative for chest pain and leg swelling.  Gastrointestinal:  Negative for abdominal pain, diarrhea, nausea and vomiting.  Musculoskeletal:  Negative for myalgias.  Neurological:  Negative for dizziness and headaches.  Psychiatric/Behavioral:  Negative for confusion.     Vital Signs: BP 118/72 (BP Location: Left Arm)   Pulse 69   Temp 98.2 F (36.8 C)   Resp 20   Ht 5\' 10"  (1.778 m)   Wt 145 lb 4.5 oz (65.9 kg)   SpO2 100%   BMI 20.85 kg/m     Physical Exam Vitals reviewed. Chaperone present: Exam conducted in the presence of video interpreting services.  Constitutional:      General: He is not in acute distress.    Appearance: He is normal weight. He is not ill-appearing.  HENT:     Mouth/Throat:     Mouth: Mucous membranes are moist.  Cardiovascular:     Rate and Rhythm: Normal rate and regular rhythm.  Pulses: Normal pulses.     Heart sounds: Normal heart sounds.  Pulmonary:     Effort: Pulmonary effort is normal.     Breath sounds: Normal breath sounds. No stridor. No wheezing or rales.  Abdominal:     General: Abdomen is flat. Bowel sounds are normal.     Palpations: Abdomen is soft.  Skin:    General: Skin is warm and dry.  Neurological:     Mental Status: He is alert and oriented to person, place, and time.  Psychiatric:        Behavior: Behavior normal.     Imaging: MR LIVER W WO CONTRAST  Result Date: 08/28/2021 CLINICAL DATA:  Indeterminate liver lesions on recent CT. EXAM: MRI ABDOMEN WITHOUT AND WITH  CONTRAST TECHNIQUE: Multiplanar multisequence MR imaging of the abdomen was performed both before and after the administration of intravenous contrast. CONTRAST:  49mL GADAVIST GADOBUTROL 1 MMOL/ML IV SOLN COMPARISON:  CT on 08/28/2021 FINDINGS: Lower chest: No acute findings. Hepatobiliary: A complex cystic lesion is seen in segment 7 of the right hepatic lobe, measuring 8.2 x 5.5 cm. This shows thin peripherally enhancing wall and internal septations, and edema in the surrounding hepatic parenchyma. This is highly suspicious for liver abscess, with a necrotic neoplasm considered less likely. No other hepatic abscess or mass identified. Focal fatty infiltration is seen in segment 4 adjacent to the falciform ligament, which corresponds to the other lesion seen in this location on prior study. Gallbladder is unremarkable. No evidence of biliary ductal dilatation. Pancreas:  No mass or inflammatory changes. Spleen:  Within normal limits in size and appearance. Adrenals/Urinary Tract: No masses identified. No evidence of hydronephrosis. Stomach/Bowel: Unremarkable. Vascular/Lymphatic: No pathologically enlarged lymph nodes identified. No acute vascular findings. Other:  None. Musculoskeletal:  No suspicious bone lesions identified. IMPRESSION: 8.2 cm complex cystic lesion in segment 7 of the right hepatic lobe, highly suspicious for liver abscess, with necrotic neoplasm considered less likely. Needle aspiration should be considered. Focal fatty infiltration in the left hepatic lobe, which corresponds to the other lesion seen on prior CT. Electronically Signed   By: Marlaine Hind M.D.   On: 08/28/2021 16:02   CT ABDOMEN PELVIS W CONTRAST  Result Date: 08/28/2021 CLINICAL DATA:  Nausea, vomiting, and acute nonlocalized abdominal pain. EXAM: CT ABDOMEN AND PELVIS WITH CONTRAST TECHNIQUE: Multidetector CT imaging of the abdomen and pelvis was performed using the standard protocol following bolus administration of  intravenous contrast. RADIATION DOSE REDUCTION: This exam was performed according to the departmental dose-optimization program which includes automated exposure control, adjustment of the mA and/or kV according to patient size and/or use of iterative reconstruction technique. CONTRAST:  133mL OMNIPAQUE IOHEXOL 300 MG/ML  SOLN COMPARISON:  None Available. FINDINGS: Lower chest: Lung bases are clear. Hepatobiliary: Somewhat poorly defined hypodense liver lesion involving the right lobe and measuring about 7 cm in maximal diameter. A smaller circumscribed low-attenuation lesion is identified in hepatic segment 4 measuring 1.4 cm diameter. In a patient of this age, this is most likely to represent hepatic abscess. Metastasis or primary liver lesion could also have this appearance. Consider follow-up with MRI of the liver in the nonemergent setting. Gallbladder and bile ducts are unremarkable. Pancreas: Unremarkable. No pancreatic ductal dilatation or surrounding inflammatory changes. Spleen: Normal in size without focal abnormality. Adrenals/Urinary Tract: Adrenal glands are unremarkable. Kidneys are normal, without renal calculi, focal lesion, or hydronephrosis. Bladder is unremarkable. Stomach/Bowel: Stomach is within normal limits. Appendix appears normal. No evidence of  bowel wall thickening, distention, or inflammatory changes. Vascular/Lymphatic: No significant vascular findings are present. No enlarged abdominal or pelvic lymph nodes. Reproductive: Prostate gland appears enlarged for age. Seminal vesicles are prominent. Possibly inflammatory process. Other: No free air or free fluid in the abdomen. Abdominal wall musculature appears intact. Musculoskeletal: No acute or significant osseous findings. IMPRESSION: 1. 7 cm poorly defined hypodense liver lesion in the right lobe with a 1.4 cm lesion in segment 4. This likely represents a hepatic abscess although primary or metastatic neoplasm could also have this  appearance. Suggest follow-up with elective MRI of the liver. 2. Prostate gland and seminal vesicles are prominent for age, possibly inflammatory process. Electronically Signed   By: Burman Nieves M.D.   On: 08/28/2021 01:40    Labs:  CBC: Recent Labs    08/27/21 2341 08/29/21 0354 08/31/21 0510 09/01/21 0432  WBC 19.0* 10.9* 10.6* 10.5  HGB 13.5 11.5* 11.7* 11.5*  HCT 40.7 35.0* 36.0* 35.4*  PLT 864* 603* 595* 629*    COAGS: No results for input(s): "INR", "APTT" in the last 8760 hours.  BMP: Recent Labs    08/29/21 0354 08/30/21 0439 08/31/21 0510 09/01/21 0432  NA 130* 130* 130* 134*  K 3.4* 4.2 3.9 3.8  CL 92* 92* 93* 98  CO2 27 27 25 28   GLUCOSE 284* 259* 245* 255*  BUN 9 9 9 8   CALCIUM 8.4* 8.4* 8.4* 8.8*  CREATININE 0.76 0.81 0.65 0.69  GFRNONAA >60 >60 >60 >60    LIVER FUNCTION TESTS: Recent Labs    08/27/21 2341 08/29/21 0354 08/30/21 0439 08/31/21 0510 09/01/21 0432  BILITOT 1.3* 0.9 0.6 0.6  --   AST 28 35 32 25  --   ALT 39 34 41 36  --   ALKPHOS 249* 212* 218* 202*  --   PROT 9.0* 7.0 7.2 7.5  --   ALBUMIN 2.9* 2.3* 2.4* 2.4* 2.4*    TUMOR MARKERS: No results for input(s): "AFPTM", "CEA", "CA199", "CHROMGRNA" in the last 8760 hours.  Assessment and Plan: Note: Encounter today conducted with the assistance of video interpreter provided by [facility]. Interpreter today was [place] with Interpreter [phone]  Branch Pacitti is a pleasant 26 yo Spanish speaking male with PMH of T2DM being seen today for evaluation for an image-guided hepatic abscess drain. The patient was admitted on 6/18 with abdominal pain, leukocytosis, and thrombocytosis. CT revealed a 7cm liver lesion in the right lobe with an additional 1.4cm lesion identified; both are suspected hepatic abscesses. Today, the patient was feeling well and eager to discuss the procedure as he is hoping to leave the hospital as soon as possible. Imaging has been reviewed by Dr 22,  and approved for possible image-guided hepatic abscess drain placement by Dr. 7/18 on 6/23.  Risks and benefits discussed with the patient including bleeding, infection, damage to adjacent structures, bowel perforation/fistula connection, and sepsis.  All of the patient's questions were answered, patient is agreeable to proceed. Consent signed and in chart.     Thank you for this interesting consult.  I greatly enjoyed meeting Breslin Hemann and look forward to participating in their care.  A copy of this report was sent to the requesting provider on this date.  Electronically Signed: 7/23, PA 09/01/2021, 3:42 PM   I spent a total of 20 Minutes    in face to face in clinical consultation, greater than 50% of which was counseling/coordinating care for image-guided hepatic abscess drain placement.

## 2021-09-02 ENCOUNTER — Inpatient Hospital Stay (HOSPITAL_COMMUNITY): Payer: Self-pay

## 2021-09-02 LAB — CULTURE, BLOOD (ROUTINE X 2)
Culture: NO GROWTH
Culture: NO GROWTH
Special Requests: ADEQUATE
Special Requests: ADEQUATE

## 2021-09-02 LAB — GLUCOSE, CAPILLARY
Glucose-Capillary: 182 mg/dL — ABNORMAL HIGH (ref 70–99)
Glucose-Capillary: 156 mg/dL — ABNORMAL HIGH (ref 70–99)
Glucose-Capillary: 206 mg/dL — ABNORMAL HIGH (ref 70–99)
Glucose-Capillary: 198 mg/dL — ABNORMAL HIGH (ref 70–99)

## 2021-09-02 LAB — URINE CYTOLOGY ANCILLARY ONLY
Chlamydia: NEGATIVE
Comment: NEGATIVE
Comment: NEGATIVE
Comment: NORMAL
Neisseria Gonorrhea: NEGATIVE
Trichomonas: NEGATIVE

## 2021-09-02 LAB — PROTIME-INR
INR: 1.1 (ref 0.8–1.2)
Prothrombin Time: 14.1 seconds (ref 11.4–15.2)

## 2021-09-02 MED ORDER — MIDAZOLAM HCL 2 MG/2ML IJ SOLN
INTRAMUSCULAR | Status: AC
  Filled 2021-09-02: qty 2

## 2021-09-02 MED ORDER — FENTANYL CITRATE (PF) 100 MCG/2ML IJ SOLN
INTRAMUSCULAR | Status: AC
  Filled 2021-09-02: qty 2

## 2021-09-02 MED ORDER — LIDOCAINE HCL 1 % IJ SOLN
INTRAMUSCULAR | Status: AC
  Administered 2021-09-02: 10 mL
  Filled 2021-09-02: qty 20

## 2021-09-03 LAB — GLUCOSE, CAPILLARY
Glucose-Capillary: 216 mg/dL — ABNORMAL HIGH (ref 70–99)
Glucose-Capillary: 291 mg/dL — ABNORMAL HIGH (ref 70–99)
Glucose-Capillary: 217 mg/dL — ABNORMAL HIGH (ref 70–99)
Glucose-Capillary: 212 mg/dL — ABNORMAL HIGH (ref 70–99)

## 2021-09-03 LAB — CBC
HCT: 36 % — ABNORMAL LOW (ref 39.0–52.0)
Hemoglobin: 11.6 g/dL — ABNORMAL LOW (ref 13.0–17.0)
MCH: 26.9 pg (ref 26.0–34.0)
MCHC: 32.2 g/dL (ref 30.0–36.0)
MCV: 83.3 fL (ref 80.0–100.0)
Platelets: 623 10*3/uL — ABNORMAL HIGH (ref 150–400)
RBC: 4.32 MIL/uL (ref 4.22–5.81)
RDW: 12.2 % (ref 11.5–15.5)
WBC: 14.1 10*3/uL — ABNORMAL HIGH (ref 4.0–10.5)
nRBC: 0 % (ref 0.0–0.2)

## 2021-09-03 LAB — MAGNESIUM: Magnesium: 2.3 mg/dL (ref 1.7–2.4)

## 2021-09-03 LAB — RENAL FUNCTION PANEL
Albumin: 2.4 g/dL — ABNORMAL LOW (ref 3.5–5.0)
Anion gap: 9 (ref 5–15)
BUN: 12 mg/dL (ref 6–20)
CO2: 30 mmol/L (ref 22–32)
Calcium: 9.1 mg/dL (ref 8.9–10.3)
Chloride: 96 mmol/L — ABNORMAL LOW (ref 98–111)
Creatinine, Ser: 0.86 mg/dL (ref 0.61–1.24)
GFR, Estimated: >60 mL/min (ref >60)
Glucose, Bld: 237 mg/dL — ABNORMAL HIGH (ref 70–99)
Phosphorus: 3.9 mg/dL (ref 2.5–4.6)
Potassium: 4.2 mmol/L (ref 3.5–5.1)
Sodium: 135 mmol/L (ref 135–145)

## 2021-09-03 MED ORDER — SODIUM CHLORIDE 0.9% FLUSH
5.0000 mL | Freq: Three times a day (TID) | INTRAVENOUS | Status: DC
  Administered 2021-09-03 – 2021-09-07 (×6): 5 mL

## 2021-09-03 MED ORDER — INSULIN GLARGINE-YFGN 100 UNIT/ML ~~LOC~~ SOLN
20.0000 [IU] | Freq: Two times a day (BID) | SUBCUTANEOUS | Status: DC
  Administered 2021-09-03 – 2021-09-05 (×5): 20 [IU] via SUBCUTANEOUS
  Filled 2021-09-03 (×6): qty 0.2

## 2021-09-03 NOTE — Progress Notes (Signed)
Referring Physician(s): Dr. Alanda Slim  Supervising Physician: Irish Lack  Patient Status:  [facility] - In-pt  Chief Complaint: Hepatic abscess  Subjective: Patient resting comfortably.  Right-sided drain in place.   Thick, bloody output.  Some expected tenderness.   Allergies: Patient has no known allergies.  Medications: Prior to Admission medications   Medication Sig Start Date End Date Taking? Authorizing Provider  glimepiride (AMARYL) 4 MG tablet Take 4 mg by mouth daily with breakfast.   Yes [provider]     Vital Signs: BP 103/69 (BP Location: Left Arm)   Pulse 71   Temp 98.3 F (36.8 C) (Oral)   Resp 18   Ht 5\' 10"  (1.778 m)   Wt 145 lb 4.5 oz (65.9 kg)   SpO2 99%   BMI 20.85 kg/m   Physical Exam NAD, alert Abdomen: right-sided drain in place.  Site clean and dry.  Output thick, bloody. Flushes easily.   Imaging: [place] Abscess Drain  Result Date: 09/02/2021 INDICATION: 26 year old male referred for drainage liver abscess EXAM: ULTRASOUND GUIDED ABSCESS DRAINAGE MEDICATIONS: The patient is currently admitted to the hospital and receiving intravenous antibiotics. The antibiotics were administered within an appropriate time frame prior to the initiation of the procedure. ANESTHESIA/SEDATION: Fentanyl 100 mcg IV; Versed 2.0 mg IV Moderate Sedation Time:  17 minutes The patient was continuously monitored during the procedure by the interventional radiology nurse under my direct supervision. COMPLICATIONS: None PROCEDURE: Informed written consent was obtained from the patient after a thorough discussion of the procedural risks, benefits and alternatives. All questions were addressed. Maximal Sterile Barrier Technique was utilized including caps, mask, sterile gowns, sterile gloves, sterile drape, hand hygiene and skin antiseptic. A timeout was performed prior to the initiation of the procedure. Ultrasound survey of the right liver lobe performed with images  stored and sent to PACs. The right lower thorax/right upper abdomen was prepped with chlorhexidine in a sterile fashion, and a sterile drape was applied covering the operative field. A sterile gown and sterile gloves were used for the procedure. Local anesthesia was provided with 1% Lidocaine. The patient was prepped and draped sterilely and the skin and subcutaneous tissues were generously infiltrated with 1% lidocaine. A 18 gauge trocar was then advanced under ultrasound guidance in an intercostal location into the right liver lobe, targeting the abscess. The stylet was removed, modified Seldinger technique was used to place a 12 [place] drain. Approximately 60 cc of frankly purulent material aspirated. Sample sent for culture. Drain was attached to bulb suction. The patient tolerated the procedure well and remained hemodynamically stable throughout. No complications were encountered and no significant blood loss was encounter. IMPRESSION: Status post ultrasound-guided drainage of right liver abscess. Signed, Yvone Neu. Miachel Roux, RPVI [facility] Electronically Signed   By: Gilmer Mor D.O.   On: 09/02/2021 15:49    Labs:  CBC: Recent Labs    08/29/21 0354 08/31/21 0510 09/01/21 0432 09/03/21 0511  WBC 10.9* 10.6* 10.5 14.1*  HGB 11.5* 11.7* 11.5* 11.6*  HCT 35.0* 36.0* 35.4* 36.0*  PLT 603* 595* 629* 623*    COAGS: Recent Labs    09/02/21 0505  INR 1.1    BMP: Recent Labs    08/30/21 0439 08/31/21 0510 09/01/21 0432 09/03/21 0511  NA 130* 130* 134* 135  K 4.2 3.9 3.8 4.2  CL 92* 93* 98 96*  CO2 27 25 28 30   GLUCOSE 259* 245* 255* 237*  BUN  9 9 8 12   CALCIUM 8.4* 8.4* 8.8* 9.1  CREATININE 0.81 0.65 0.69 0.86  GFRNONAA >60 >60 >60 >60    LIVER FUNCTION TESTS: Recent Labs    08/27/21 2341 08/29/21 0354 08/30/21 0439 08/31/21 0510 09/01/21 0432 09/03/21 0511  BILITOT 1.3* 0.9 0.6 0.6  --   --   AST 28  35 32 25  --   --   ALT 39 34 41 36  --   --   ALKPHOS 249* 212* 218* 202*  --   --   PROT 9.0* 7.0 7.2 7.5  --   --   ALBUMIN 2.9* 2.3* 2.4* 2.4* 2.4* 2.4*    Assessment and Plan: Hepatic abscess Patient with improvement s/p drain placement by Dr. Loreta Ave yesterday.  Tmax 101 overnight.  WBC up to 14.1 Output thick, bloody.  Needs frequent flushing.   Drain Location: RUQ Size: Fr size: 12 Fr Date of placement: 6/23  Currently to: Drain collection device: suction bulb 24 hour output:  Output by Drain (mL) 09/01/21 0701 - 09/01/21 1900 09/01/21 1901 - 09/02/21 0700 09/02/21 0701 - 09/02/21 1900 09/02/21 1901 - 09/03/21 0700 09/03/21 0701 - 09/03/21 1508  Closed System Drain 1 Lateral;Right RLQ Bulb (JP)    15 20    Interval imaging/drain manipulation:  None  Current examination: Flushes/aspirates easily.  Insertion site unremarkable. Suture and stat lock in place. Dressed appropriately.   Plan: Continue TID flushes with 5 cc NS. Record output Q shift. Dressing changes QD or PRN if soiled.  Call IR APP or on call IR MD if difficulty flushing or sudden change in drain output.  Repeat imaging once output < 10 mL/QD (excluding flush material). Consideration for drain removal if output is < 10 mL/QD (excluding flush material), pending discussion with team.   Discharge planning: Please contact IR APP or on call IR MD prior to patient d/c to ensure appropriate follow up plans are in place. Typically patient will follow up with IR clinic 10-14 days post d/c for repeat imaging. IR scheduler will contact patient with date/time of appointment. Patient will need to flush drain QD with 5 cc NS, record output QD, dressing changes every 2-3 days or earlier if soiled.   IR will continue to follow - please call with questions or concerns.     Electronically Signed: Hoyt Koch, PA 09/03/2021, 3:04 PM   I spent a total of 15 Minutes at the the patient's bedside AND on the  patient's hospital floor or unit, greater than 50% of which was counseling/coordinating care for hepatic abscess.

## 2021-09-04 LAB — CBC
HCT: 35.7 % — ABNORMAL LOW (ref 39.0–52.0)
Hemoglobin: 11.6 g/dL — ABNORMAL LOW (ref 13.0–17.0)
MCH: 27.1 pg (ref 26.0–34.0)
MCHC: 32.5 g/dL (ref 30.0–36.0)
MCV: 83.4 fL (ref 80.0–100.0)
Platelets: 651 10*3/uL — ABNORMAL HIGH (ref 150–400)
RBC: 4.28 MIL/uL (ref 4.22–5.81)
RDW: 12.2 % (ref 11.5–15.5)
WBC: 9.6 10*3/uL (ref 4.0–10.5)
nRBC: 0 % (ref 0.0–0.2)

## 2021-09-04 LAB — RENAL FUNCTION PANEL
Albumin: 2.4 g/dL — ABNORMAL LOW (ref 3.5–5.0)
Anion gap: 8 (ref 5–15)
BUN: 10 mg/dL (ref 6–20)
CO2: 31 mmol/L (ref 22–32)
Calcium: 9 mg/dL (ref 8.9–10.3)
Chloride: 98 mmol/L (ref 98–111)
Creatinine, Ser: 0.79 mg/dL (ref 0.61–1.24)
GFR, Estimated: >60 mL/min (ref >60)
Glucose, Bld: 171 mg/dL — ABNORMAL HIGH (ref 70–99)
Phosphorus: 3.6 mg/dL (ref 2.5–4.6)
Potassium: 3.8 mmol/L (ref 3.5–5.1)
Sodium: 137 mmol/L (ref 135–145)

## 2021-09-04 LAB — MAGNESIUM: Magnesium: 2.3 mg/dL (ref 1.7–2.4)

## 2021-09-04 LAB — GLUCOSE, CAPILLARY
Glucose-Capillary: 158 mg/dL — ABNORMAL HIGH (ref 70–99)
Glucose-Capillary: 201 mg/dL — ABNORMAL HIGH (ref 70–99)
Glucose-Capillary: 129 mg/dL — ABNORMAL HIGH (ref 70–99)
Glucose-Capillary: 236 mg/dL — ABNORMAL HIGH (ref 70–99)

## 2021-09-04 NOTE — Plan of Care (Signed)
  Problem: Education: Goal: Knowledge of General Education information will improve Description: Including pain rating scale, medication(s)/side effects and non-pharmacologic comfort measures Outcome: Progressing   Problem: Clinical Measurements: Goal: Respiratory complications will improve Outcome: Not Applicable   Problem: Activity: Goal: Risk for activity intolerance will decrease Outcome: Not Applicable   Problem: Nutrition: Goal: Adequate nutrition will be maintained Outcome: Progressing   Problem: Pain Managment: Goal: General experience of comfort will improve Outcome: Progressing

## 2021-09-05 LAB — GLUCOSE, CAPILLARY
Glucose-Capillary: 142 mg/dL — ABNORMAL HIGH (ref 70–99)
Glucose-Capillary: 266 mg/dL — ABNORMAL HIGH (ref 70–99)
Glucose-Capillary: 91 mg/dL (ref 70–99)
Glucose-Capillary: 249 mg/dL — ABNORMAL HIGH (ref 70–99)

## 2021-09-05 MED ORDER — INSULIN GLARGINE-YFGN 100 UNIT/ML ~~LOC~~ SOLN
25.0000 [IU] | Freq: Two times a day (BID) | SUBCUTANEOUS | Status: DC
Start: 2021-09-05 — End: 2021-09-07
  Administered 2021-09-05 – 2021-09-07 (×4): 25 [IU] via SUBCUTANEOUS
  Filled 2021-09-05 (×5): qty 0.25

## 2021-09-05 NOTE — Progress Notes (Addendum)
Referring Physician(s): Dr. Alanda Slim  Supervising Physician: Richarda Overlie  Patient Status:  [facility] - In-pt  Chief Complaint: Hepatic abscess  Subjective: Patient lying in bed, NAD. He reports slight tenderness at his drain site when he gets out of bed, but states that it is not too bad. Right sided drain in place, site intact with dressing, suture, and stat lock in place  Allergies: Patient has no known allergies.  Medications: Prior to Admission medications   Medication Sig Start Date End Date Taking? Authorizing Provider  glimepiride (AMARYL) 4 MG tablet Take 4 mg by mouth daily with breakfast.   Yes [provider]     Vital Signs: BP 101/63 (BP Location: Left Arm)   Pulse 61   Temp 98.5 F (36.9 C) (Oral)   Resp 20   Ht 5\' 10"  (1.778 m)   Wt 145 lb 4.5 oz (65.9 kg)   SpO2 100%   BMI 20.85 kg/m   Physical Exam Constitutional:      General: He is not in acute distress.    Appearance: He is normal weight.  Cardiovascular:     Rate and Rhythm: Normal rate and regular rhythm.     Heart sounds: Normal heart sounds.  Pulmonary:     Effort: Pulmonary effort is normal.     Breath sounds: Normal breath sounds.  Skin:    General: Skin is warm and dry.  Neurological:     Mental Status: He is alert.   NAD, alert Abdomen: right-sided drain in place.  Site clean and dry.  Output thick, bloody. Flushes/aspirates easily.   Imaging: [place] Abscess Drain  Result Date: 09/02/2021 INDICATION: 26 year old male referred for drainage liver abscess EXAM: ULTRASOUND GUIDED ABSCESS DRAINAGE MEDICATIONS: The patient is currently admitted to the hospital and receiving intravenous antibiotics. The antibiotics were administered within an appropriate time frame prior to the initiation of the procedure. ANESTHESIA/SEDATION: Fentanyl 100 mcg IV; Versed 2.0 mg IV Moderate Sedation Time:  17 minutes The patient was continuously monitored during the procedure by the interventional radiology  nurse under my direct supervision. COMPLICATIONS: None PROCEDURE: Informed written consent was obtained from the patient after a thorough discussion of the procedural risks, benefits and alternatives. All questions were addressed. Maximal Sterile Barrier Technique was utilized including caps, mask, sterile gowns, sterile gloves, sterile drape, hand hygiene and skin antiseptic. A timeout was performed prior to the initiation of the procedure. Ultrasound survey of the right liver lobe performed with images stored and sent to PACs. The right lower thorax/right upper abdomen was prepped with chlorhexidine in a sterile fashion, and a sterile drape was applied covering the operative field. A sterile gown and sterile gloves were used for the procedure. Local anesthesia was provided with 1% Lidocaine. The patient was prepped and draped sterilely and the skin and subcutaneous tissues were generously infiltrated with 1% lidocaine. A 18 gauge trocar was then advanced under ultrasound guidance in an intercostal location into the right liver lobe, targeting the abscess. The stylet was removed, modified Seldinger technique was used to place a 12 [place] drain. Approximately 60 cc of frankly purulent material aspirated. Sample sent for culture. Drain was attached to bulb suction. The patient tolerated the procedure well and remained hemodynamically stable throughout. No complications were encountered and no significant blood loss was encounter. IMPRESSION: Status post ultrasound-guided drainage of right liver abscess. Signed, Yvone Neu. Miachel Roux, RPVI [facility] Electronically Signed   By: Gilmer Mor  D.O.   On: 09/02/2021 15:49    Labs:  CBC: Recent Labs    08/31/21 0510 09/01/21 0432 09/03/21 0511 09/04/21 0448  WBC 10.6* 10.5 14.1* 9.6  HGB 11.7* 11.5* 11.6* 11.6*  HCT 36.0* 35.4* 36.0* 35.7*  PLT 595* 629* 623* 651*     COAGS: Recent Labs     09/02/21 0505  INR 1.1     BMP: Recent Labs    08/31/21 0510 09/01/21 0432 09/03/21 0511 09/04/21 0448  NA 130* 134* 135 137  K 3.9 3.8 4.2 3.8  CL 93* 98 96* 98  CO2 25 28 30 31   GLUCOSE 245* 255* 237* 171*  BUN 9 8 12 10   CALCIUM 8.4* 8.8* 9.1 9.0  CREATININE 0.65 0.69 0.86 0.79  GFRNONAA >60 >60 >60 >60     LIVER FUNCTION TESTS: Recent Labs    08/27/21 2341 08/29/21 0354 08/30/21 0439 08/31/21 0510 09/01/21 0432 09/03/21 0511 09/04/21 0448  BILITOT 1.3* 0.9 0.6 0.6  --   --   --   AST 28 35 32 25  --   --   --   ALT 39 34 41 36  --   --   --   ALKPHOS 249* 212* 218* 202*  --   --   --   PROT 9.0* 7.0 7.2 7.5  --   --   --   ALBUMIN 2.9* 2.3* 2.4* 2.4* 2.4* 2.4* 2.4*     Assessment and Plan: Hepatic abscess Patient with improvement s/p drain placement by Dr. Loreta Ave 6/23  Afebrile WBC down to 9.6 on 6/25 Output bloody is bloody. Approximately 10mL in suction bulb at time of exam.   Drain Location: RUQ Size: Fr size: 12 Fr Date of placement: 6/23  Currently to: Drain collection device: suction bulb 24 hour output:  Output by Drain (mL) 09/03/21 0701 - 09/03/21 1900 09/03/21 1901 - 09/04/21 0700 09/04/21 0701 - 09/04/21 1900 09/04/21 1901 - 09/05/21 0700 09/05/21 0701 - 09/05/21 1322  Closed System Drain 1 Lateral;Right RLQ Bulb (JP) 40 0.3 10 15 20     Interval imaging/drain manipulation:  None  Current examination: Flushes/aspirates easily.  Insertion site unremarkable. Suture and stat lock in place. Dressed appropriately.   Plan: Continue TID flushes with 5 cc NS. Record output Q shift. Dressing changes QD or PRN if soiled.  Call IR APP or on call IR MD if difficulty flushing or sudden change in drain output.  Repeat imaging once output < 10 mL/QD (excluding flush material). Consideration for drain removal if output is < 10 mL/QD (excluding flush material), pending discussion with team.   Discharge planning: Please contact IR APP or on  call IR MD prior to patient d/c to ensure appropriate follow up plans are in place. Typically patient will follow up with IR clinic 10-14 days post d/c for repeat imaging. IR scheduler will contact patient with date/time of appointment. Patient will need to flush drain QD with 5 cc NS, record output QD, dressing changes every 2-3 days or earlier if soiled.   IR will continue to follow - please call with questions or concerns.     Electronically Signed: Kennieth Francois, PA-C 09/05/2021, 1:22 PM   I spent a total of 15 Minutes at the the patient's bedside AND on the patient's hospital floor or unit, greater than 50% of which was counseling/coordinating care for hepatic abscess.

## 2021-09-05 NOTE — Progress Notes (Signed)
Brief ID note:  Patient status post ultrasound-guided abscess drain placement 09/02/2021 with interventional radiology.  Drain output over the last 24 hours has been approximately 45 cc recorded.  Blood cultures from 6/18 are finalized as no growth and abscess drain cultures from 6/23 are currently in process.  He is stable on Zosyn with improvement in leukocytosis and no fevers recorded.  We will continue this for now and follow-up cultures.   Vedia Coffer for Infectious Disease [facility] 09/05/2021, 11:03 AM

## 2021-09-06 LAB — RENAL FUNCTION PANEL
Albumin: 2.4 g/dL — ABNORMAL LOW (ref 3.5–5.0)
Anion gap: 8 (ref 5–15)
BUN: 9 mg/dL (ref 6–20)
CO2: 30 mmol/L (ref 22–32)
Calcium: 9 mg/dL (ref 8.9–10.3)
Chloride: 101 mmol/L (ref 98–111)
Creatinine, Ser: 0.62 mg/dL (ref 0.61–1.24)
GFR, Estimated: >60 mL/min (ref >60)
Glucose, Bld: 172 mg/dL — ABNORMAL HIGH (ref 70–99)
Phosphorus: 3.7 mg/dL (ref 2.5–4.6)
Potassium: 3.7 mmol/L (ref 3.5–5.1)
Sodium: 139 mmol/L (ref 135–145)

## 2021-09-06 LAB — CBC
HCT: 33.8 % — ABNORMAL LOW (ref 39.0–52.0)
Hemoglobin: 10.6 g/dL — ABNORMAL LOW (ref 13.0–17.0)
MCH: 26.4 pg (ref 26.0–34.0)
MCHC: 31.4 g/dL (ref 30.0–36.0)
MCV: 84.1 fL (ref 80.0–100.0)
Platelets: 647 10*3/uL — ABNORMAL HIGH (ref 150–400)
RBC: 4.02 MIL/uL — ABNORMAL LOW (ref 4.22–5.81)
RDW: 12.2 % (ref 11.5–15.5)
WBC: 7.6 10*3/uL (ref 4.0–10.5)
nRBC: 0 % (ref 0.0–0.2)

## 2021-09-06 LAB — GLUCOSE, CAPILLARY
Glucose-Capillary: 144 mg/dL — ABNORMAL HIGH (ref 70–99)
Glucose-Capillary: 139 mg/dL — ABNORMAL HIGH (ref 70–99)
Glucose-Capillary: 102 mg/dL — ABNORMAL HIGH (ref 70–99)
Glucose-Capillary: 248 mg/dL — ABNORMAL HIGH (ref 70–99)

## 2021-09-06 LAB — MAGNESIUM: Magnesium: 2.1 mg/dL (ref 1.7–2.4)

## 2021-09-06 MED ORDER — INSULIN STARTER KIT- PEN NEEDLES (SPANISH)
1.0000 | Freq: Once | Status: AC
Start: 2021-09-06 — End: 2021-09-06
  Administered 2021-09-06: 1
  Filled 2021-09-06: qty 1

## 2021-09-06 MED ORDER — LIVING WELL WITH DIABETES BOOK - IN SPANISH
Freq: Once | Status: AC
Start: 2021-09-06 — End: 2021-09-06
  Filled 2021-09-06: qty 1

## 2021-09-06 NOTE — Progress Notes (Signed)
Inpatient Diabetes Program Recommendations  AACE/ADA: New Consensus Statement on Inpatient Glycemic Control (2015)  Target Ranges:  Prepandial:   less than 140 mg/dL      Peak postprandial:   less than 180 mg/dL (1-2 hours)      Critically ill patients:  140 - 180 mg/dL   Lab Results  Component Value Date   GLUCAP 139 (H) 09/06/2021   HGBA1C 12.3 (H) 08/28/2021    Review of Glycemic Control  Current orders for Inpatient glycemic control: Semglee 25 units BID, Novolog 0-15 units TID with meals and 0-5 HS + 7 units TID  Inpatient Diabetes Program Recommendations:    For discharge:  Novolin ReliOn 70/30 Flexpen insulin pen (# [id number]) 70/30 18 units BID  Blood glucose meter kit (#[id number])  Insulin pen needles (#[id number])  Used nurse tech as spanish interpreter.  Educated patient on insulin pen use at home. Reviewed contents of insulin flexpen starter kit. Reviewed all steps if insulin pen including attachment of needle, 2-unit air shot, dialing up dose, giving injection, removing needle, disposal of sharps, storage of unused insulin, disposal of insulin etc. Patient able to provide successful return demonstration. Also reviewed troubleshooting with insulin pen. MD to give patient Rxs for insulin pens and insulin pen needles.  Answered all questions. Recommended pt f/u with PCP regarding testing for Type 1, 1.5 (MODY).  Continue to follow.   Thank you. Lorenda Peck, RD, LDN, CDE Inpatient Diabetes Coordinator [phone]

## 2021-09-07 ENCOUNTER — Other Ambulatory Visit: Payer: Self-pay

## 2021-09-07 LAB — GLUCOSE, CAPILLARY
Glucose-Capillary: 163 mg/dL — ABNORMAL HIGH (ref 70–99)
Glucose-Capillary: 95 mg/dL (ref 70–99)

## 2021-09-07 MED ORDER — AMOXICILLIN-POT CLAVULANATE 875-125 MG PO TABS
1.0000 | ORAL_TABLET | Freq: Two times a day (BID) | ORAL | 0 refills | Status: DC
Start: 2021-09-07 — End: 2021-09-29
  Filled 2021-09-07: qty 56, 28d supply, fill #0

## 2021-09-07 MED ORDER — SODIUM CHLORIDE 0.9% FLUSH
5.0000 mL | Freq: Three times a day (TID) | INTRAVENOUS | 0 refills | Status: DC
  Filled 2021-09-07: qty 450, 30d supply, fill #0

## 2021-09-07 MED ORDER — TRUE METRIX BLOOD GLUCOSE TEST VI STRP
ORAL_STRIP | Freq: Four times a day (QID) | 0 refills | Status: AC
  Filled 2021-09-07: qty 100, 25d supply, fill #0

## 2021-09-07 MED ORDER — BASAGLAR KWIKPEN 100 UNIT/ML ~~LOC~~ SOPN
25.0000 [IU] | PEN_INJECTOR | Freq: Two times a day (BID) | SUBCUTANEOUS | 3 refills | Status: DC
  Filled 2021-09-07: qty 15, 30d supply, fill #0
  Filled 2021-09-29: qty 15, 30d supply, fill #1

## 2021-09-07 MED ORDER — INSULIN LISPRO (1 UNIT DIAL) 100 UNIT/ML (KWIKPEN)
10.0000 [IU] | PEN_INJECTOR | Freq: Three times a day (TID) | SUBCUTANEOUS | 3 refills | Status: DC
  Filled 2021-09-07: qty 9, 30d supply, fill #0
  Filled 2021-09-29: qty 9, 30d supply, fill #1
  Filled 2021-11-04: qty 9, 30d supply, fill #2
  Filled 2021-12-08: qty 9, 30d supply, fill #3

## 2021-09-07 MED ORDER — INSULIN PEN NEEDLE 32G X 6 MM MISC
1.0000 | Freq: Three times a day (TID) | 1 refills | Status: DC
  Filled 2021-09-07: qty 100, 25d supply, fill #0
  Filled 2021-09-07: qty 200, 50d supply, fill #0
  Filled 2022-01-19: qty 100, 25d supply, fill #1
  Filled 2022-03-02: qty 100, 25d supply, fill #2
  Filled 2022-06-09: qty 100, 25d supply, fill #3

## 2021-09-07 MED ORDER — BLOOD GLUCOSE METER KIT
PACK | 0 refills | Status: AC
  Filled 2021-09-07: qty 1, 30d supply, fill #0

## 2021-09-07 MED ORDER — TRUEPLUS LANCETS 28G MISC
Freq: Four times a day (QID) | 0 refills | Status: AC
  Filled 2021-09-07: qty 100, 25d supply, fill #0

## 2021-09-07 NOTE — Plan of Care (Signed)
  Problem: Education: Goal: Knowledge of General Education information will improve Description: Including pain rating scale, medication(s)/side effects and non-pharmacologic comfort measures Outcome: Progressing   Problem: Health Behavior/Discharge Planning: Goal: Ability to manage health-related needs will improve Outcome: Progressing   Problem: Clinical Measurements: Goal: Ability to maintain clinical measurements within normal limits will improve Outcome: Progressing Goal: Will remain free from infection Outcome: Progressing Goal: Diagnostic test results will improve Outcome: Progressing Goal: Cardiovascular complication will be avoided Outcome: Progressing   Problem: Nutrition: Goal: Adequate nutrition will be maintained Outcome: Progressing   Problem: Coping: Goal: Level of anxiety will decrease Outcome: Progressing   Problem: Elimination: Goal: Will not experience complications related to bowel motility Outcome: Progressing Goal: Will not experience complications related to urinary retention Outcome: Progressing   Problem: Pain Managment: Goal: General experience of comfort will improve Outcome: Progressing   Problem: Safety: Goal: Ability to remain free from injury will improve Outcome: Progressing   Problem: Skin Integrity: Goal: Risk for impaired skin integrity will decrease Outcome: Progressing   Problem: Education: Goal: Ability to describe self-care measures that may prevent or decrease complications (Diabetes Survival Skills Education) will improve Outcome: Progressing Goal: Individualized Educational Video(s) Outcome: Progressing   Problem: Coping: Goal: Ability to adjust to condition or change in health will improve Outcome: Progressing   Problem: Fluid Volume: Goal: Ability to maintain a balanced intake and output will improve Outcome: Progressing   Problem: Health Behavior/Discharge Planning: Goal: Ability to identify and utilize available  resources and services will improve Outcome: Progressing Goal: Ability to manage health-related needs will improve Outcome: Progressing   Problem: Metabolic: Goal: Ability to maintain appropriate glucose levels will improve Outcome: Progressing   Problem: Nutritional: Goal: Maintenance of adequate nutrition will improve Outcome: Progressing Goal: Progress toward achieving an optimal weight will improve Outcome: Progressing   Problem: Skin Integrity: Goal: Risk for impaired skin integrity will decrease Outcome: Progressing   Problem: Tissue Perfusion: Goal: Adequacy of tissue perfusion will improve Outcome: Progressing

## 2021-09-07 NOTE — Discharge Summary (Signed)
Physician Discharge Summary  Adam Skinner RDE:[id number] DOB: 1995-07-09 DOA: 08/27/2021  PCP: Pcp, No  Admit date: 08/27/2021 Discharge date: 09/07/2021 Admitted From: Home Disposition: Home Recommendations for Outpatient Follow-up:  Follow up at [facility] on 10/03/2021 Follow-up with infectious disease on 09/29/2021 Interventional radiology to arrange outpatient follow-up Please obtain CBC and CMP at follow-up Please reassess blood glucose levels and adjust diabetic medication as appropriate Consider work-up for type 1 diabetes Please follow up on the following pending results: None  Home Health: Not indicated Equipment/Devices: Not indicated  Discharge Condition: Stable CODE STATUS: Full code   Hospital course 26 year old Spanish-speaking male with PMH of DM-2 presenting with nausea, vomiting and epigastric abdominal pain and admitted for liver abscess.  Patient moved from [place] to [place] about 5 months ago.  CT abdomen and pelvis showed 7 cm poorly defined hypodense liver lesion in the right lobe of the liver and an additional 1.4 cm lesion, and prominent prostate gland and seminal vesicles for age.  MRI showed a 2.2 cm complex cystic lesion of the right hepatic lobe highly suspicious for liver abscess, and focal fatty infiltration in the left hepatic lobe.  Patient was started on IV Zosyn.  Entamoeba histolytica antibody test negative.  Patient had image guided liver abscess drainage with removal of 60 cc of frankly purulent material on 6/23.  Abscess culture with pansensitive E. coli except to ampicillin.  He was discharged on p.o. Augmentin for 4 weeks per ID recommendation.  Outpatient follow-up with PCP and ID as above.  IR to arrange outpatient follow-up for further drain management  See individual problem list below for more.   Problems addressed during this hospitalization Principal Problem:   Liver abscess Active Problems:   Uncontrolled  diabetes mellitus with hyperglycemia, without long-term current use of insulin (HCC)   Epigastric pain   Hyponatremia   Bandemia   Normocytic anemia   Language barrier   Liver abscess: recently moved from [place] to [place] about 5 months ago.  Denies drinking stream water or well water.MRI showed 8.2 x 5.5 cm complex cystic lesion in right hepatic lobe suspicious for abscess. CRP 23.1 and ESR 123.  HIV, GC, CT, trach, and Entamoeba histolytica antibody negative.  Abscess culture with pansensitive E. coli except to ampicillin. -S/p US guided drainage on 09/02/2021. -Continue IV Zosyn 6/18-6/28>> p.o. Augmentin for 4 weeks. -ID follow-up as above -Patient to continue flushing drain every 8 hours -IR to arrange outpatient follow-up.    Uncontrolled NIDDM-2 with hyperglycemia: A1c 12.3%. -Discharged on Basaglar 25 units twice daily and Humalog 10 units 3 times daily -Diabetic teaching provided  -Supplies ordered. -Rx filled at [facility]. -Recommend work-up for type 1 diabetes  Hyponatremia: Partly in the setting of hyperglycemia.  Resolved. -Continue monitoring   Leukocytosis/thrombocytosis: Leukocytosis resolved. -Recheck CBC at follow-up   Normocytic anemia: Relatively stable.  Continue monitoring   Language barrier affecting communication  Vital signs Vitals:   09/06/21 1310 09/06/21 2057 09/07/21 0612 09/07/21 1309  BP: 111/71 102/68 102/64 103/64  Pulse: (!) 59 65 69 71  Temp:  97.7 F (36.5 C) 98 F (36.7 C) 98 F (36.7 C)  Resp: 16 (!) _0 Height:      Weight:      SpO2: 100% 100% 100% 100%  TempSrc:  Oral Oral   BMI (Calculated):         Discharge exam  GENERAL: No apparent distress.  Nontoxic. HEENT:  MMM.  Vision and hearing grossly intact.  NECK: Supple.  No apparent JVD.  RESP:  No IWOB.  Fair aeration bilaterally. CVS:  RRR. Heart sounds normal.  ABD/GI/GU: BS+. Abd soft, NTND.  RUQ drain.  Small pink and turbid  output MSK/EXT:  Moves extremities. No apparent deformity. No edema.  SKIN: no apparent skin lesion or wound NEURO: Awake and alert. Oriented appropriately.  No apparent focal neuro deficit. PSYCH: Calm. Normal affect.   Discharge Instructions Discharge Instructions     Call MD for:  difficulty breathing, headache or visual disturbances   Complete by: As directed    Call MD for:  extreme fatigue   Complete by: As directed    Call MD for:  persistant dizziness or light-headedness   Complete by: As directed    Call MD for:  persistant nausea and vomiting   Complete by: As directed    Call MD for:  severe uncontrolled pain   Complete by: As directed    Call MD for:  temperature >100.4   Complete by: As directed    Diet Carb Modified   Complete by: As directed    Discharge instructions   Complete by: As directed    It has been a pleasure taking care of you!  You were hospitalized due to liver abscess for which you have been treated surgically medically with antibiotics.  We are discharging you more antibiotics to complete treatment course.  Continue flushing the drain management per recommendation by interventional radiology.  It is also very important that you have your blood glucose/diabetes under good control.  Using your insulin as prescribed.  Would recommend checking your blood glucose up to 4 times a day and keeping blood glucose log for your primary care doctor to review.  Go to your follow-up appointments as recommended.   Take care,   Increase activity slowly   Complete by: As directed    No wound care   Complete by: As directed       Allergies as of 09/07/2021   No Known Allergies      Medication List     STOP taking these medications    glimepiride 4 MG tablet Commonly known as: AMARYL       TAKE these medications    amoxicillin-clavulanate 875-125 MG tablet Commonly known as: AUGMENTIN Take 1 tablet by mouth 2 (two) times daily for 28 days.    Basaglar KwikPen 100 UNIT/ML Inject 25 Units into the skin 2 (two) times daily.   HumaLOG KwikPen 100 UNIT/ML KwikPen Generic drug: insulin lispro Inject 10 Units into the skin 3 (three) times daily.   Novofine Pen Needle 32G X 6 MM Misc Generic drug: Insulin Pen Needle use 4 (four) times daily -  before meals and at bedtime.   sodium chloride flush 0.9 % Soln Commonly known as: NS 5 mLs by Intracatheter route every 8 (eight) hours.   True Metrix Blood Glucose Test test strip Generic drug: glucose blood use up to 4 times a day as directed   True Metrix Meter w/Device Kit Use up to 4 times a day as directed   TRUEplus Lancets 28G Misc use up to 4 times a day as directed        Consultations: Infectious disease Interventional radiology Diabetic coronary  Procedures/Studies: 09/02/2021-US guided liver abscess drain placement   [place] Abscess Drain  Result Date: 09/02/2021 INDICATION: 26 year old male referred for drainage liver abscess EXAM: ULTRASOUND GUIDED ABSCESS DRAINAGE MEDICATIONS: The patient is  currently admitted to the hospital and receiving intravenous antibiotics. The antibiotics were administered within an appropriate time frame prior to the initiation of the procedure. ANESTHESIA/SEDATION: Fentanyl 100 mcg IV; Versed 2.0 mg IV Moderate Sedation Time:  17 minutes The patient was continuously monitored during the procedure by the interventional radiology nurse under my direct supervision. COMPLICATIONS: None PROCEDURE: Informed written consent was obtained from the patient after a thorough discussion of the procedural risks, benefits and alternatives. All questions were addressed. Maximal Sterile Barrier Technique was utilized including caps, mask, sterile gowns, sterile gloves, sterile drape, hand hygiene and skin antiseptic. A timeout was performed prior to the initiation of the procedure. Ultrasound survey of the right liver lobe performed with images stored and sent  to PACs. The right lower thorax/right upper abdomen was prepped with chlorhexidine in a sterile fashion, and a sterile drape was applied covering the operative field. A sterile gown and sterile gloves were used for the procedure. Local anesthesia was provided with 1% Lidocaine. The patient was prepped and draped sterilely and the skin and subcutaneous tissues were generously infiltrated with 1% lidocaine. A 18 gauge trocar was then advanced under ultrasound guidance in an intercostal location into the right liver lobe, targeting the abscess. The stylet was removed, modified Seldinger technique was used to place a 12 [place] drain. Approximately 60 cc of frankly purulent material aspirated. Sample sent for culture. Drain was attached to bulb suction. The patient tolerated the procedure well and remained hemodynamically stable throughout. No complications were encountered and no significant blood loss was encounter. IMPRESSION: Status post ultrasound-guided drainage of right liver abscess. Signed, Dulcy Fanny. Nadene Rubins, RPVI [facility] Electronically Signed   By: Corrie Mckusick D.O.   On: 09/02/2021 15:49   MR LIVER W WO CONTRAST  Result Date: 08/28/2021 CLINICAL DATA:  Indeterminate liver lesions on recent CT. EXAM: MRI ABDOMEN WITHOUT AND WITH CONTRAST TECHNIQUE: Multiplanar multisequence MR imaging of the abdomen was performed both before and after the administration of intravenous contrast. CONTRAST:  29m GADAVIST GADOBUTROL 1 MMOL/ML IV SOLN COMPARISON:  CT on 08/28/2021 FINDINGS: Lower chest: No acute findings. Hepatobiliary: A complex cystic lesion is seen in segment 7 of the right hepatic lobe, measuring 8.2 x 5.5 cm. This shows thin peripherally enhancing wall and internal septations, and edema in the surrounding hepatic parenchyma. This is highly suspicious for liver abscess, with a necrotic neoplasm considered less likely. No other hepatic  abscess or mass identified. Focal fatty infiltration is seen in segment 4 adjacent to the falciform ligament, which corresponds to the other lesion seen in this location on prior study. Gallbladder is unremarkable. No evidence of biliary ductal dilatation. Pancreas:  No mass or inflammatory changes. Spleen:  Within normal limits in size and appearance. Adrenals/Urinary Tract: No masses identified. No evidence of hydronephrosis. Stomach/Bowel: Unremarkable. Vascular/Lymphatic: No pathologically enlarged lymph nodes identified. No acute vascular findings. Other:  None. Musculoskeletal:  No suspicious bone lesions identified. IMPRESSION: 8.2 cm complex cystic lesion in segment 7 of the right hepatic lobe, highly suspicious for liver abscess, with necrotic neoplasm considered less likely. Needle aspiration should be considered. Focal fatty infiltration in the left hepatic lobe, which corresponds to the other lesion seen on prior CT. Electronically Signed   By: JMarlaine HindM.D.   On: 08/28/2021 16:02   CT ABDOMEN PELVIS W CONTRAST  Result Date: 08/28/2021 CLINICAL DATA:  Nausea, vomiting, and acute nonlocalized abdominal pain. EXAM: CT ABDOMEN AND PELVIS  WITH CONTRAST TECHNIQUE: Multidetector CT imaging of the abdomen and pelvis was performed using the standard protocol following bolus administration of intravenous contrast. RADIATION DOSE REDUCTION: This exam was performed according to the departmental dose-optimization program which includes automated exposure control, adjustment of the mA and/or kV according to patient size and/or use of iterative reconstruction technique. CONTRAST:  153m OMNIPAQUE IOHEXOL 300 MG/ML  SOLN COMPARISON:  None Available. FINDINGS: Lower chest: Lung bases are clear. Hepatobiliary: Somewhat poorly defined hypodense liver lesion involving the right lobe and measuring about 7 cm in maximal diameter. A smaller circumscribed low-attenuation lesion is identified in hepatic segment 4  measuring 1.4 cm diameter. In a patient of this age, this is most likely to represent hepatic abscess. Metastasis or primary liver lesion could also have this appearance. Consider follow-up with MRI of the liver in the nonemergent setting. Gallbladder and bile ducts are unremarkable. Pancreas: Unremarkable. No pancreatic ductal dilatation or surrounding inflammatory changes. Spleen: Normal in size without focal abnormality. Adrenals/Urinary Tract: Adrenal glands are unremarkable. Kidneys are normal, without renal calculi, focal lesion, or hydronephrosis. Bladder is unremarkable. Stomach/Bowel: Stomach is within normal limits. Appendix appears normal. No evidence of bowel wall thickening, distention, or inflammatory changes. Vascular/Lymphatic: No significant vascular findings are present. No enlarged abdominal or pelvic lymph nodes. Reproductive: Prostate gland appears enlarged for age. Seminal vesicles are prominent. Possibly inflammatory process. Other: No free air or free fluid in the abdomen. Abdominal wall musculature appears intact. Musculoskeletal: No acute or significant osseous findings. IMPRESSION: 1. 7 cm poorly defined hypodense liver lesion in the right lobe with a 1.4 cm lesion in segment 4. This likely represents a hepatic abscess although primary or metastatic neoplasm could also have this appearance. Suggest follow-up with elective MRI of the liver. 2. Prostate gland and seminal vesicles are prominent for age, possibly inflammatory process. Electronically Signed   By: WLucienne CapersM.D.   On: 08/28/2021 01:40       The results of significant diagnostics from this hospitalization (including imaging, microbiology, ancillary and laboratory) are listed below for reference.     Microbiology: Recent Results (from the past 240 hour(s))  Aerobic/Anaerobic Culture w Gram Stain (surgical/deep wound)     Status: None (Preliminary result)   Collection Time: 09/02/21  2:58 PM   Specimen: Abscess   Result Value Ref Range Status   Specimen Description ABSCESS  Final   Special Requests LIVER  Final   Gram Stain   Final    FEW WBC PRESENT, PREDOMINANTLY PMN RARE GRAM NEGATIVE RODS [facility] [address] [place] [id number]   Culture   Final    MODERATE KLEBSIELLA PNEUMONIAE NO ANAEROBES ISOLATED; CULTURE IN PROGRESS FOR 5 DAYS    Report Status PENDING  Incomplete   Organism ID, Bacteria KLEBSIELLA PNEUMONIAE  Final      Susceptibility   Klebsiella pneumoniae - MIC*    AMPICILLIN RESISTANT Resistant     CEFAZOLIN <=4 SENSITIVE Sensitive     CEFEPIME <=0.12 SENSITIVE Sensitive     CEFTAZIDIME <=1 SENSITIVE Sensitive     CEFTRIAXONE <=0.25 SENSITIVE Sensitive     CIPROFLOXACIN <=0.25 SENSITIVE Sensitive     GENTAMICIN <=1 SENSITIVE Sensitive     IMIPENEM <=0.25 SENSITIVE Sensitive     TRIMETH/SULFA <=20 SENSITIVE Sensitive     AMPICILLIN/SULBACTAM 4 SENSITIVE Sensitive     PIP/TAZO <=4 SENSITIVE Sensitive     * MODERATE KLEBSIELLA PNEUMONIAE     Labs:  CBC:  Recent Labs  Lab 09/01/21 0432 09/03/21 0511 09/04/21 0448 09/06/21 0427  WBC 10.5 14.1* 9.6 7.6  NEUTROABS 6.1  --   --   --   HGB 11.5* 11.6* 11.6* 10.6*  HCT 35.4* 36.0* 35.7* 33.8*  MCV 82.3 83.3 83.4 84.1  PLT 629* 623* 651* 647*   BMP &GFR Recent Labs  Lab 09/01/21 0432 09/03/21 0511 09/04/21 0448 09/06/21 0427  NA 134* 135 137 139  K 3.8 4.2 3.8 3.7  CL 98 96* 98 101  CO2 _0 GLUCOSE 255* 237* 171* 172*  BUN _1 CREATININE 0.69 0.86 0.79 0.62  CALCIUM 8.8* 9.1 9.0 9.0  MG 2.1 2.3 2.3 2.1  PHOS 3.3 3.9 3.6 3.7   Estimated Creatinine Clearance: 131.6 mL/min (by C-G formula based on SCr of 0.62 mg/dL). Liver & Pancreas: Recent Labs  Lab 09/01/21 0432 09/03/21 0511 09/04/21 0448 09/06/21 0427  ALBUMIN 2.4* 2.4* 2.4* 2.4*   No results for input(s): "LIPASE", "AMYLASE" in the last 168 hours. No results for input(s): "AMMONIA" in the last  168 hours. Diabetic: No results for input(s): "HGBA1C" in the last 72 hours. Recent Labs  Lab 09/06/21 1145 09/06/21 1656 09/06/21 2126 09/07/21 0718 09/07/21 1139  GLUCAP 139* 102* 248* 163* 95   Cardiac Enzymes: No results for input(s): "CKTOTAL", "CKMB", "CKMBINDEX", "TROPONINI" in the last 168 hours. No results for input(s): "PROBNP" in the last 8760 hours. Coagulation Profile: Recent Labs  Lab 09/02/21 0505  INR 1.1   Thyroid Function Tests: No results for input(s): "TSH", "T4TOTAL", "FREET4", "T3FREE", "THYROIDAB" in the last 72 hours. Lipid Profile: No results for input(s): "CHOL", "HDL", "LDLCALC", "TRIG", "CHOLHDL", "LDLDIRECT" in the last 72 hours. Anemia Panel: No results for input(s): "VITAMINB12", "FOLATE", "FERRITIN", "TIBC", "IRON", "RETICCTPCT" in the last 72 hours. Urine analysis:    Component Value Date/Time   COLORURINE YELLOW 08/27/2021 2341   APPEARANCEUR CLEAR 08/27/2021 2341   LABSPEC 1.028 08/27/2021 2341   PHURINE 6.0 08/27/2021 2341   GLUCOSEU >=500 (A) 08/27/2021 2341   HGBUR MODERATE (A) 08/27/2021 2341   BILIRUBINUR NEGATIVE 08/27/2021 2341   KETONESUR 80 (A) 08/27/2021 2341   PROTEINUR 100 (A) 08/27/2021 2341   NITRITE NEGATIVE 08/27/2021 2341   LEUKOCYTESUR NEGATIVE 08/27/2021 2341   Sepsis Labs: Invalid input(s): "PROCALCITONIN", "LACTICIDVEN"   SIGNED:  Mercy Riding, MD  [facility] 09/07/2021, 5:32 PM

## 2021-09-07 NOTE — TOC Transition Note (Signed)
Transition of Care [facility]) - CM/SW Discharge Note   Patient Details  Name: Adam Skinner MRN: [id number] Date of Birth: 31-Dec-1995  [facility] CM/SW Contact:  Amada Jupiter, LCSW Phone Number: 09/07/2021, 1:49 PM   Clinical Narrative:    Pt anticipated to dc home today.  Have spoken with [facility] and confirming pt can get dc meds filled for no cost since he has a new patient appt scheduled for 10/03/21.  No further TOC needs.   Final next level of care: Home/Self Care Barriers to Discharge: Barriers Resolved   Patient Goals and CMS Choice Patient states their goals for this hospitalization and ongoing recovery are:: return home      Discharge Placement                       Discharge Plan and Services In-house Referral: Clinical Social Work Discharge Planning Services: Medication Assistance            DME Arranged: N/A DME Agency: NA                  Social Determinants of Health (SDOH) Interventions     Readmission Risk Interventions    09/01/2021    2:59 PM  Readmission Risk Prevention Plan  Post Dischage Appt Complete  Medication Screening Complete  Transportation Screening Complete

## 2021-09-07 NOTE — Plan of Care (Signed)
  Problem: Education: Goal: Knowledge of General Education information will improve Description: Including pain rating scale, medication(s)/side effects and non-pharmacologic comfort measures Outcome: Completed/Met   Problem: Health Behavior/Discharge Planning: Goal: Ability to manage health-related needs will improve Outcome: Completed/Met   Problem: Clinical Measurements: Goal: Ability to maintain clinical measurements within normal limits will improve Outcome: Completed/Met Goal: Will remain free from infection Outcome: Completed/Met Goal: Diagnostic test results will improve Outcome: Completed/Met Goal: Cardiovascular complication will be avoided Outcome: Completed/Met   Problem: Nutrition: Goal: Adequate nutrition will be maintained Outcome: Completed/Met   Problem: Coping: Goal: Level of anxiety will decrease Outcome: Completed/Met   Problem: Elimination: Goal: Will not experience complications related to bowel motility Outcome: Completed/Met Goal: Will not experience complications related to urinary retention Outcome: Completed/Met   Problem: Pain Managment: Goal: General experience of comfort will improve Outcome: Completed/Met   Problem: Safety: Goal: Ability to remain free from injury will improve Outcome: Completed/Met   Problem: Skin Integrity: Goal: Risk for impaired skin integrity will decrease Outcome: Completed/Met   Problem: Education: Goal: Ability to describe self-care measures that may prevent or decrease complications (Diabetes Survival Skills Education) will improve Outcome: Completed/Met Goal: Individualized Educational Video(s) Outcome: Completed/Met   Problem: Coping: Goal: Ability to adjust to condition or change in health will improve Outcome: Completed/Met   Problem: Fluid Volume: Goal: Ability to maintain a balanced intake and output will improve Outcome: Completed/Met   Problem: Health Behavior/Discharge Planning: Goal: Ability  to identify and utilize available resources and services will improve Outcome: Completed/Met Goal: Ability to manage health-related needs will improve Outcome: Completed/Met   Problem: Metabolic: Goal: Ability to maintain appropriate glucose levels will improve Outcome: Completed/Met   Problem: Nutritional: Goal: Maintenance of adequate nutrition will improve Outcome: Completed/Met Goal: Progress toward achieving an optimal weight will improve Outcome: Completed/Met   Problem: Skin Integrity: Goal: Risk for impaired skin integrity will decrease Outcome: Completed/Met   Problem: Tissue Perfusion: Goal: Adequacy of tissue perfusion will improve Outcome: Completed/Met

## 2021-09-07 NOTE — Progress Notes (Signed)
      [facility]  Date of Admission:  08/27/2021           Reason for visit: Follow up on liver abscess  Current antibiotics: Zosyn   ASSESSMENT:    26 y.o. male admitted with:  # Liver abscess - s/p IR drain placement 6/23 - 24 hr output measured as 60cc - drain cx = Klebsiella pneumoniae - planning for discharge today  # Uncontrolled DM - ? Undiagnosed type 1 DM - A1c is 12.3  RECOMMENDATIONS:    Will switch to Augmentin and discharge with 4 week supply Will need follow up with IR drain clinic after discharge as well pending output to determine timing of drain removal and repeat imaging Follow up scheduled on 09/29/21 at 3:30pm with Dr Earlene Plater Will sign off, please call back as needed     Lady Deutscher [facility] [phone] pager 09/07/2021, 11:42 AM  I spent > 5 min reviewing chart, discussing with [facility], and formulating plan

## 2021-09-08 LAB — CYTOLOGY - NON PAP

## 2021-09-10 LAB — AEROBIC/ANAEROBIC CULTURE W GRAM STAIN (SURGICAL/DEEP WOUND)

## 2021-09-12 ENCOUNTER — Other Ambulatory Visit: Payer: Self-pay | Admitting: Internal Medicine

## 2021-09-12 DIAGNOSIS — K750 Abscess of liver: Secondary | ICD-10-CM

## 2021-09-14 ENCOUNTER — Other Ambulatory Visit: Payer: Self-pay

## 2021-09-15 ENCOUNTER — Ambulatory Visit
Admission: RE | Admit: 2021-09-15 | Discharge: 2021-09-15 | Disposition: A | Discharge status: Home / Self Care | Payer: Self-pay | Source: Ambulatory Visit | Attending: Internal Medicine | Admitting: Internal Medicine

## 2021-09-15 ENCOUNTER — Ambulatory Visit
Admission: RE | Admit: 2021-09-15 | Discharge: 2021-09-15 | Disposition: A | Discharge status: Home / Self Care | Payer: Self-pay | Source: Ambulatory Visit | Attending: Radiology | Admitting: Radiology

## 2021-09-15 ENCOUNTER — Other Ambulatory Visit: Payer: Self-pay | Admitting: Internal Medicine

## 2021-09-15 ENCOUNTER — Encounter: Payer: Self-pay | Admitting: *Deleted

## 2021-09-15 DIAGNOSIS — K750 Abscess of liver: Secondary | ICD-10-CM

## 2021-09-15 HISTORY — PX: IR RADIOLOGIST EVAL & MGMT: IMG5224

## 2021-09-15 MED ORDER — IOPAMIDOL (ISOVUE-300) INJECTION 61%
100.0000 mL | Freq: Once | INTRAVENOUS | Status: AC | PRN
  Administered 2021-09-15: 100 mL via INTRAVENOUS

## 2021-09-15 NOTE — Progress Notes (Signed)
Referring Physician(s): Allred,Darrell K  Chief Complaint: The patient is seen in follow up today s/p liver abscess drain placed 09/02/21 by Dr. Earleen Newport  History of present illness:  Adam Skinner, 26 year old Spanish-speaking male, has a medical history significant for diabetes type 2. He was admitted to [facility] 08/28/21 with abdominal pain, leukocytosis and thrombocytosis. He had recently traveled to [place]. CT abdomen/pelvis was positive for a 7 cm poorly defined liver lesion in the right lobe concerning for a possible abscess. IR was consulted for abscess drain placement and this was done 09/02/21. 60 ml of purulent fluid was aspirated with cultures positive for E. Coli. He was discharged home from the hospital 09/07/21.  Adam Skinner presents today to the [facility] Radiology outpatient clinic for drain follow up including CT imaging. He is accompanied by a Spanish-speaking interpreter. He reports a daily drain output of 5 ml/day and he is flushing the drain daily with 5 ml. He denies fevers, chills, nausea/vomiting, diarrhea or pain. He continues to take oral antibiotics (Augmentin x 4 weeks per Infectious Disease).   Past Medical History:  Diagnosis Date   Diabetes ([place])     No past surgical history on file.  Allergies: Patient has no known allergies.  Medications: Prior to Admission medications   Medication Sig Start Date End Date Taking? Authorizing Provider  amoxicillin-clavulanate (AUGMENTIN) 875-125 MG tablet Take 1 tablet by mouth 2 (two) times daily for 28 days. 09/07/21 10/05/21  Mercy Riding, MD  blood glucose meter kit and supplies Use up to 4 times a day as directed 09/07/21   Wendee Beavers T, MD  glucose blood (TRUE METRIX BLOOD GLUCOSE TEST) test strip use up to 4 times a day as directed 09/07/21   Mercy Riding, MD  Insulin Glargine (BASAGLAR KWIKPEN) 100 UNIT/ML Inject 25 Units into the skin 2 (two) times daily. 09/07/21   Mercy Riding, MD  insulin  lispro (HUMALOG) 100 UNIT/ML KwikPen Inject 10 Units into the skin 3 (three) times daily. 09/07/21   Mercy Riding, MD  Insulin Pen Needle 32G X 6 MM MISC use 4 (four) times daily -  before meals and at bedtime. 09/07/21   Mercy Riding, MD  sodium chloride flush (NS) 0.9 % SOLN 5 mLs by Intracatheter route every 8 (eight) hours. 09/07/21 10/07/21  Mercy Riding, MD  TRUEplus Lancets 28G MISC use up to 4 times a day as directed 09/07/21   Mercy Riding, MD     Family History  Problem Relation Age of Onset   Heart disease Neg Hx     Social History   Socioeconomic History   Marital status: Married    Spouse name: Not on file   Number of children: Not on file   Years of education: Not on file   Highest education level: Not on file  Occupational History   Not on file  Tobacco Use   Smoking status: Never   Smokeless tobacco: Never  Substance and Sexual Activity   Alcohol use: Not Currently   Drug use: Never   Sexual activity: Not on file  Other Topics Concern   Not on file  Social History Narrative   Not on file   Social Determinants of Health   Financial Resource Strain: Not on file  Food Insecurity: Not on file  Transportation Needs: Not on file  Physical Activity: Not on file  Stress: Not on file  Social Connections: Not on file  Vital Signs: There were no vitals taken for this visit.  Physical Exam Constitutional:      General: He is not in acute distress.    Appearance: He is not ill-appearing.  Pulmonary:     Effort: Pulmonary effort is normal.  Abdominal:     Palpations: Abdomen is soft.     Tenderness: There is no abdominal tenderness.     Comments: RUQ drain to suction. Approximately 20 ml of thin, red fluid in bulb. Suture and stat-lock intact. Skin insertion site is unremarkable with just a scant amount of dried drainage. Dressing is clean, dry and intact. Drain easily flushed with 5 ml NS  Skin:    General: Skin is warm and dry.  Neurological:      Mental Status: He is alert and oriented to person, place, and time.     Imaging: No results found.  Labs:  CBC: Recent Labs    09/01/21 0432 09/03/21 0511 09/04/21 0448 09/06/21 0427  WBC 10.5 14.1* 9.6 7.6  HGB 11.5* 11.6* 11.6* 10.6*  HCT 35.4* 36.0* 35.7* 33.8*  PLT 629* 623* 651* 647*    COAGS: Recent Labs    09/02/21 0505  INR 1.1    BMP: Recent Labs    09/01/21 0432 09/03/21 0511 09/04/21 0448 09/06/21 0427  NA 134* 135 137 139  K 3.8 4.2 3.8 3.7  CL 98 96* 98 101  CO2 _0 GLUCOSE 255* 237* 171* 172*  BUN _1 CALCIUM 8.8* 9.1 9.0 9.0  CREATININE 0.69 0.86 0.79 0.62  GFRNONAA >60 >60 >60 >60    LIVER FUNCTION TESTS: Recent Labs    08/27/21 2341 08/29/21 0354 08/30/21 0439 08/31/21 0510 09/01/21 0432 09/03/21 0511 09/04/21 0448 09/06/21 0427  BILITOT 1.3* 0.9 0.6 0.6  --   --   --   --   AST 28 35 32 25  --   --   --   --   ALT 39 34 41 36  --   --   --   --   ALKPHOS 249* 212* 218* 202*  --   --   --   --   PROT 9.0* 7.0 7.2 7.5  --   --   --   --   ALBUMIN 2.9* 2.3* 2.4* 2.4* 2.4* 2.4* 2.4* 2.4*    Assessment:  Liver abscess s/p drain placement in IR 09/02/21  CT imaging today shows an improved appearance and a decrease in the size of the right hepatic abscess. The percutaneous drainage catheter is well-positioned within the residual abscess. Due to the residual abscess, IR will plan to give the patient a few more weeks with the drain in place.   IR will plan to see the patient back in the clinic in approximately 2 weeks for a repeat CT AP with contrast. The patient will continue to flush the drain daily with 5 ml NS, change the dressing daily/as needed and will record the daily output. He knows a Marketing executive from our clinic will call him with a date/time of his next appointment. He has a follow up appointments with other members of his healthcare team on July 20 and July 24 (Infectious Disease and [employer])  and he was encouraged to keep these appointments. The patient was given extra gauze, tape and normal saline flushes. He knows he can call our clinic with any questions/concerns prior to his next visit.   The patient did inquire about getting  a refill on his insulin and he was encouraged to contact his primary care provider.   Signed: Theresa Duty, NP 09/15/2021, 12:54 PM   Please refer to Dr. Patricia Nettle attestation of this note for management and plan.

## 2021-09-27 ENCOUNTER — Encounter: Payer: Self-pay | Admitting: *Deleted

## 2021-09-27 ENCOUNTER — Ambulatory Visit
Admission: RE | Admit: 2021-09-27 | Discharge: 2021-09-27 | Disposition: A | Discharge status: Home / Self Care | Payer: Self-pay | Source: Ambulatory Visit | Attending: Internal Medicine | Admitting: Internal Medicine

## 2021-09-27 DIAGNOSIS — K750 Abscess of liver: Secondary | ICD-10-CM

## 2021-09-27 HISTORY — PX: IR RADIOLOGIST EVAL & MGMT: IMG5224

## 2021-09-27 MED ORDER — IOPAMIDOL (ISOVUE-300) INJECTION 61%
100.0000 mL | Freq: Once | INTRAVENOUS | Status: AC | PRN
  Administered 2021-09-27: 100 mL via INTRAVENOUS

## 2021-09-27 NOTE — Progress Notes (Signed)
Referring Physician(s): [facility]  Chief Complaint: The patient is seen in follow up today s/p right hepatic abscess drain placed on 6.23.23  History of present illness:  26 y.o. Spanish speaking male outpatient. History of DM. Presented to the ED at [facility] with nausea and  vomiting, Found to have a liver abscess. IR placed a into abdominal abscess drain to the right liver fluid collection on 6.23.23. Culture shows moderate klebsiella pneumonia. Patient was discharged on Augmentin which he is currently still taking.  He presents today with Spanish Interpretor for follow-up CT scan and drain evaluation.  He is currently flushing the drain once daily with 5 mL of saline.  Output has been minimal, averaging less 5 mL/day.  He currently denies fever, headache, chest pain, dyspnea, cough, abdominal pain, nausea, vomiting or bleeding.    Past Medical History:  Diagnosis Date   Diabetes [facility])     Past Surgical History:  Procedure Laterality Date   IR RADIOLOGIST EVAL & MGMT  09/15/2021    Allergies: Patient has no known allergies.  Medications: Prior to Admission medications   Medication Sig Start Date End Date Taking? Authorizing Provider  amoxicillin-clavulanate (AUGMENTIN) 875-125 MG tablet Take 1 tablet by mouth 2 (two) times daily for 28 days. 09/07/21 10/05/21  Mercy Riding, MD  blood glucose meter kit and supplies Use up to 4 times a day as directed 09/07/21   Wendee Beavers T, MD  glucose blood (TRUE METRIX BLOOD GLUCOSE TEST) test strip use up to 4 times a day as directed 09/07/21   Mercy Riding, MD  Insulin Glargine (BASAGLAR KWIKPEN) 100 UNIT/ML Inject 25 Units into the skin 2 (two) times daily. 09/07/21   Mercy Riding, MD  insulin lispro (HUMALOG) 100 UNIT/ML KwikPen Inject 10 Units into the skin 3 (three) times daily. 09/07/21   Mercy Riding, MD  Insulin Pen Needle 32G X 6 MM MISC use 4 (four) times daily -  before meals and at bedtime. 09/07/21   Mercy Riding, MD  sodium  chloride flush (NS) 0.9 % SOLN 5 mLs by Intracatheter route every 8 (eight) hours. 09/07/21 10/07/21  Mercy Riding, MD  TRUEplus Lancets 28G MISC use up to 4 times a day as directed 09/07/21   Mercy Riding, MD     Family History  Problem Relation Age of Onset   Heart disease Neg Hx     Social History   Socioeconomic History   Marital status: Married    Spouse name: Not on file   Number of children: Not on file   Years of education: Not on file   Highest education level: Not on file  Occupational History   Not on file  Tobacco Use   Smoking status: Never   Smokeless tobacco: Never  Substance and Sexual Activity   Alcohol use: Not Currently   Drug use: Never   Sexual activity: Not on file  Other Topics Concern   Not on file  Social History Narrative   Not on file   Social Determinants of Health   Financial Resource Strain: Not on file  Food Insecurity: Not on file  Transportation Needs: Not on file  Physical Activity: Not on file  Stress: Not on file  Social Connections: Not on file     Vital Signs: There were no vitals taken for this visit.  Physical Exam Vitals and nursing note reviewed.  Constitutional:      Appearance: He is well-developed and normal  weight.  HENT:     Head: Normocephalic.  Pulmonary:     Effort: Pulmonary effort is normal.  Abdominal:     Comments: Positive RUQ drain to suction.  Site is unremarkable with no erythema, edema, tenderness, bleeding or drainage noted at exit site. Suture and stat lock in place. Dressing is clean dry and intact. < 5 ml of  pink colored fluid noted in the bulb suction device.   Musculoskeletal:        General: Normal range of motion.     Cervical back: Normal range of motion.  Skin:    General: Skin is dry.  Neurological:     Mental Status: He is alert and oriented to person, place, and time.     Imaging: CT ABDOMEN PELVIS W CONTRAST  Result Date: 09/27/2021 CLINICAL DATA:  Status post percutaneous  catheter drainage hepatic abscess on 09/02/2021. EXAM: CT ABDOMEN AND PELVIS WITH CONTRAST TECHNIQUE: Multidetector CT imaging of the abdomen and pelvis was performed using the standard protocol following bolus administration of intravenous contrast. RADIATION DOSE REDUCTION: This exam was performed according to the departmental dose-optimization program which includes automated exposure control, adjustment of the mA and/or kV according to patient size and/or use of iterative reconstruction technique. CONTRAST:  158mL ISOVUE-300 IOPAMIDOL (ISOVUE-300) INJECTION 61% COMPARISON:  Prior imaging studies with the most recent dated 09/15/2021. FINDINGS: Lower chest: No acute abnormality. Hepatobiliary: Previously noted residual abscess in the posterior right lobe of the liver has resolved. There is a mild amount of edema remaining adjacent to the indwelling percutaneous drain. No liquefied abscess remains. No additional fluid collections in the liver. No biliary ductal dilatation. The gallbladder is unremarkable. Pancreas: Unremarkable. No pancreatic ductal dilatation or surrounding inflammatory changes. Spleen: Normal in size without focal abnormality. Adrenals/Urinary Tract: Adrenal glands are unremarkable. Kidneys are normal, without renal calculi, focal lesion, or hydronephrosis. Bladder is unremarkable. Stomach/Bowel: Bowel shows no evidence of obstruction, ileus, inflammation or lesion. The appendix is normal. No free intraperitoneal air. Vascular/Lymphatic: No significant vascular findings are present. No enlarged abdominal or pelvic lymph nodes. Reproductive: Prostate is unremarkable. Other: No abdominal wall hernia or abnormality. No abdominopelvic ascites. Musculoskeletal: No acute or significant osseous findings. IMPRESSION: Resolved hepatic abscess with mild amount of residual edema remaining in the liver adjacent to the indwelling percutaneous drainage catheter. No residual liquefied abscess remains.  Electronically Signed   By: Aletta Edouard M.D.   On: 09/27/2021 13:52    Labs:  CBC: Recent Labs    09/01/21 0432 09/03/21 0511 09/04/21 0448 09/06/21 0427  WBC 10.5 14.1* 9.6 7.6  HGB 11.5* 11.6* 11.6* 10.6*  HCT 35.4* 36.0* 35.7* 33.8*  PLT 629* 623* 651* 647*    COAGS: Recent Labs    09/02/21 0505  INR 1.1    BMP: Recent Labs    09/01/21 0432 09/03/21 0511 09/04/21 0448 09/06/21 0427  NA 134* 135 137 139  K 3.8 4.2 3.8 3.7  CL 98 96* 98 101  CO2 $Re'28 30 31 30  'AFJ$ GLUCOSE 255* 237* 171* 172*  BUN $Re'8 12 10 9  'MIE$ CALCIUM 8.8* 9.1 9.0 9.0  CREATININE 0.69 0.86 0.79 0.62  GFRNONAA >60 >60 >60 >60    LIVER FUNCTION TESTS: Recent Labs    08/27/21 2341 08/29/21 0354 08/30/21 0439 08/31/21 0510 09/01/21 0432 09/03/21 0511 09/04/21 0448 09/06/21 0427  BILITOT 1.3* 0.9 0.6 0.6  --   --   --   --   AST 28 35 32 25  --   --   --   --  ALT 39 34 41 36  --   --   --   --   ALKPHOS 249* 212* 218* 202*  --   --   --   --   PROT 9.0* 7.0 7.2 7.5  --   --   --   --   ALBUMIN 2.9* 2.3* 2.4* 2.4* 2.4* 2.4* 2.4* 2.4*    Assessment:  26 y.o. Spanish speaking male outpatient. History of DM. Presented to the ED at [facility] with nausea and  vomiting, Found to have a liver abscess. IR placed a into abdominal abscess drain to the right liver fluid collection on 6.23.23. Culture shows moderate klebsiella pneumonia. Patient was discharged on Augmentin which he is currently still taking.  He presents today with Spanish Interpretor for follow-up CT scan and drain evaluation.  He is currently flushing the drain once daily with 5 mL of normal saline.  Output has been minimal, averaging less 5 mL/day.  He currently denies fever, headache, chest pain, dyspnea, cough, abdominal pain, nausea, vomiting or bleeding.    The hepatic abscess drain is placed to suction bulb with pink output noted to be in the bulb.  The suture and stat locks are in place. No  edema, tenderness, bleeding or drainage noted  at exit site. Dressing is clean dry and intact. Follow-up CT of abdomen pelvis done today reveals resolved hepatic abscess with stable drain position.  No new abdominal pelvic fluid collections.     Discussed case with Dr. Kathlene Cote who recommends drain removal at this time.  Hepatic abscess drain removed intact, no complications,  patient tolerated procedure well, dressing applied to exit site.   Post-removal instructions: - Okay  to shower/sponge bath 24 hours post-removal. - No submerging (swimming, bathing) for 7 days post-removal. - Keep the dressing/bandage on to take shower, take dressing/bandage off and pat dry  the area completely before placing a new dressing/bandage  - Look for signs and symptoms of infection such as reddening of skin, pus like drainage,     fever and/or chills - Change dressing PRN until site fully healed.    Patient verbalized understanding.    Signed: Jacqualine Mau, NP 09/27/2021, 2:04 PM   Please refer to Dr. Kathlene Cote attestation of this note for management and plan.

## 2021-09-29 ENCOUNTER — Other Ambulatory Visit: Payer: Self-pay

## 2021-09-29 ENCOUNTER — Ambulatory Visit (INDEPENDENT_AMBULATORY_CARE_PROVIDER_SITE_OTHER): Payer: Self-pay | Admitting: Internal Medicine

## 2021-09-29 ENCOUNTER — Encounter: Payer: Self-pay | Admitting: Internal Medicine

## 2021-09-29 DIAGNOSIS — E1165 Type 2 diabetes mellitus with hyperglycemia: Secondary | ICD-10-CM

## 2021-09-29 DIAGNOSIS — K750 Abscess of liver: Secondary | ICD-10-CM

## 2021-09-29 NOTE — Assessment & Plan Note (Signed)
Discussed importance of long term glycemic control to prevent future infections.  He has primary care appointment next week and was encouraged to keep this follow up.  We called the [facility] this afternoon and confirmed that he has refills available and was encouraged to go there after our visit.

## 2021-09-29 NOTE — Assessment & Plan Note (Signed)
Patient is here today for hospital follow up for liver abscess due to Klebsiella pneumonia and is currently on a prolonged course of Augmentin.  His imaging on 09/27/21 showed resolution of abscess and IR drain was removed.  Given this resolution and that he has completed 2 days of continued antibiotics following drain removal, will have him stop antibiotics.  Return precautions discussed and follow up PRN.

## 2021-09-29 NOTE — Progress Notes (Signed)
Adam Skinner for Infectious Disease  CHIEF COMPLAINT:    Follow up for liver abscess  SUBJECTIVE:    Adam Skinner is a 26 y.o. male with PMHx as below who presents to the clinic for liver abscess.   Patient is here today for hospital follow up.  He was admitted 6/174-09/07/21 at [facility] with sepsis and found to have a new liver abscess in the setting of poorly controlled diabetes.  Patient underwent IR guided drain placement on 6/23.  Cultures grew a sensitive Kleb pna.  He wa treated with IV antibiotics during admission and discharged home on Augmentin x 4 weeks with ID and IR follow up.   Since discharge, he saw IR on 7/6 where repeat imaging showed continued abscess that was overall improved.  Another IR follow up with CT on 09/27/21 this week showed the right hepatic abscess was resolved and the drainage cathter was removed.  He continues to feel well following drain removal.  He has been taking his Augmentin without any issues.  He has about another week left of his antibiotic.   Unfortunately, he reports that he has not received any insulin since hospital discharge so has not been taking anything for his diabetes. He states he went to [facility] to pick up insulin but was told they would not sell it to him without a prescription.  However, it appears that insulin was prescribed to the [name] at hospital discharge with 3 refills.  He reports being unaware of this but we called the pharmacy and they report he filled it on 6/28 so I am not sure what the situation is and the language barrier is making it challenging to sort out.   Please see A&P for the details of today's visit and status of the patient's medical problems.   Patient's Medications  New Prescriptions   No medications on file  Previous Medications   BLOOD GLUCOSE METER KIT AND SUPPLIES    Use up to 4 times a day as directed   GLUCOSE BLOOD (TRUE METRIX BLOOD  GLUCOSE TEST) TEST STRIP    use up to 4 times a day as directed   INSULIN GLARGINE (BASAGLAR KWIKPEN) 100 UNIT/ML    Inject 25 Units into the skin 2 (two) times daily.   INSULIN LISPRO (HUMALOG) 100 UNIT/ML KWIKPEN    Inject 10 Units into the skin 3 (three) times daily.   INSULIN PEN NEEDLE 32G X 6 MM MISC    use 4 (four) times daily -  before meals and at bedtime.   SODIUM CHLORIDE FLUSH (NS) 0.9 % SOLN    5 mLs by Intracatheter route every 8 (eight) hours.   TRUEPLUS LANCETS 28G MISC    use up to 4 times a day as directed  Modified Medications   No medications on file  Discontinued Medications   AMOXICILLIN-CLAVULANATE (AUGMENTIN) 875-125 MG TABLET    Take 1 tablet by mouth 2 (two) times daily for 28 days.      Past Medical History:  Diagnosis Date   Diabetes ([place])     Social History   Tobacco Use   Smoking status: Never   Smokeless tobacco: Never  Substance Use Topics   Alcohol use: Not Currently   Drug use: Never    Family History  Problem Relation Age of Onset   Heart disease Neg Hx     No Known Allergies  Review of Systems  Constitutional:  Negative.   Gastrointestinal: Negative.      OBJECTIVE:    Vitals:   09/29/21 1447  BP: 140/90  Pulse: (!) 126  Temp: 98.2 F (36.8 C)  TempSrc: Oral  SpO2: 98%   There is no height or weight on file to calculate BMI.  Physical Exam Constitutional:      General: He is not in acute distress.    Appearance: Normal appearance.     Comments: Patient seen today with help of Spanish interpreter.   HENT:     Head: Normocephalic and atraumatic.  Eyes:     Extraocular Movements: Extraocular movements intact.     Conjunctiva/sclera: Conjunctivae normal.  Pulmonary:     Effort: Pulmonary effort is normal. No respiratory distress.  Abdominal:     General: There is no distension.     Palpations: Abdomen is soft.     Tenderness: There is no abdominal tenderness.  Musculoskeletal:     Cervical back: Normal range of  motion and neck supple.  Skin:    General: Skin is warm and dry.  Neurological:     General: No focal deficit present.     Mental Status: He is alert and oriented to person, place, and time.  Psychiatric:        Mood and Affect: Mood normal.        Behavior: Behavior normal.      Labs and Microbiology:    Latest Ref Rng & Units 09/06/2021    4:27 AM 09/04/2021    4:48 AM 09/03/2021    5:11 AM  CBC  WBC 4.0 - 10.5 K/uL 7.6  9.6  14.1   Hemoglobin 13.0 - 17.0 g/dL 10.6  11.6  11.6   Hematocrit 39.0 - 52.0 % 33.8  35.7  36.0   Platelets 150 - 400 K/uL 647  651  623       Latest Ref Rng & Units 09/06/2021    4:27 AM 09/04/2021    4:48 AM 09/03/2021    5:11 AM  CMP  Glucose 70 - 99 mg/dL 172  171  237   BUN 6 - 20 mg/dL _0 Creatinine 0.61 - 1.24 mg/dL 0.62  0.79  0.86   Sodium 135 - 145 mmol/L 139  137  135   Potassium 3.5 - 5.1 mmol/L 3.7  3.8  4.2   Chloride 98 - 111 mmol/L 101  98  96   CO2 22 - 32 mmol/L _1 Calcium 8.9 - 10.3 mg/dL 9.0  9.0  9.1        ASSESSMENT & PLAN:    Liver abscess Patient is here today for hospital follow up for liver abscess due to Klebsiella pneumonia and is currently on a prolonged course of Augmentin.  His imaging on 09/27/21 showed resolution of abscess and IR drain was removed.  Given this resolution and that he has completed 2 days of continued antibiotics following drain removal, will have him stop antibiotics.  Return precautions discussed and follow up PRN.  Uncontrolled diabetes mellitus with hyperglycemia, without long-term current use of insulin ([place]) Discussed importance of long term glycemic control to prevent future infections.  He has primary care appointment next week and was encouraged to keep this follow up.  We called the [facility] this afternoon and confirmed that he has refills available and was encouraged to go there after our visit.  Raynelle Highland for  Infectious Disease [facility] 09/29/2021, 3:13 PM  I have personally spent 40 minutes involved in face-to-face and non-face-to-face activities for this patient on the day of the visit. Professional time spent includes the following activities: Preparing to see the patient (review of tests), Obtaining and/or reviewing separately obtained history (admission/discharge record), Performing a medically appropriate examination and/or evaluation , Ordering medications/tests/procedures, referring and communicating with other health care professionals, Documenting clinical information in the EMR, Independently interpreting results (not separately reported), Communicating results to the patient/family/caregiver, Counseling and educating the patient/family/caregiver and Care coordination (not separately reported).

## 2021-10-03 ENCOUNTER — Ambulatory Visit: Payer: Self-pay | Attending: Internal Medicine | Admitting: Internal Medicine

## 2021-10-03 ENCOUNTER — Other Ambulatory Visit: Payer: Self-pay

## 2021-10-03 ENCOUNTER — Encounter: Payer: Self-pay | Admitting: Internal Medicine

## 2021-10-03 VITALS — BP 111/78 | HR 106 | Temp 99.1°F | Wt 152.2 lb

## 2021-10-03 DIAGNOSIS — K750 Abscess of liver: Secondary | ICD-10-CM

## 2021-10-03 DIAGNOSIS — Z7689 Persons encountering health services in other specified circumstances: Secondary | ICD-10-CM

## 2021-10-03 DIAGNOSIS — Z794 Long term (current) use of insulin: Secondary | ICD-10-CM

## 2021-10-03 DIAGNOSIS — D649 Anemia, unspecified: Secondary | ICD-10-CM

## 2021-10-03 DIAGNOSIS — E1165 Type 2 diabetes mellitus with hyperglycemia: Secondary | ICD-10-CM

## 2021-10-03 LAB — GLUCOSE, POCT (MANUAL RESULT ENTRY): POC Glucose: 241 mg/dl — AB (ref 70–99)

## 2021-10-03 MED ORDER — BASAGLAR KWIKPEN 100 UNIT/ML ~~LOC~~ SOPN
27.0000 [IU] | PEN_INJECTOR | Freq: Two times a day (BID) | SUBCUTANEOUS | 3 refills | Status: DC
  Filled 2021-10-03: qty 15, 28d supply, fill #0

## 2021-10-03 NOTE — Progress Notes (Signed)
Patient ID: Adam Skinner, male    DOB: Sep 30, 1995  MRN: [id number]  CC: Hospitalization Follow-up   Subjective: Adam Skinner is a 26 y.o. male who presents for new hospital follow-up His concerns today include:  Patient with history of DM type II, liver abscess, anemia  Patient hospitalized last month with sepsis.  Found to have liver abscess.  Interventional radiology placed a drain.  Cultures grew Klebsiella pneumonia.  Treated with antibiotics.  Discharged home on Augmentin for total of 4 weeks.  Found to be anemic with H/H of 10.6/33.8 Also found to have untreated DM type II with A1c of 12.3%.  Dischg on insulin Basaglar 25 units BID and Humalog 10 units with meals Saw IR 09/27/2021.  Right hepatic abscess improved.  Catheter drainage removed. Saw ID Dr. Earlene Plater 09/29/2021.  Abx d/c last wk.  Today: Reports feeling hot 2 days ago but he was having sore throat and some congestion at the time.  Symptoms have since resolved. Has a low grade temp today in office. No abdominal pain  DM: Lab Results  Component Value Date   HGBA1C 12.3 (H) 08/28/2021  -dx 8 yrs ago.  Reports being placed on Metformin then Amaryl. -currently on Basaglar 25 units BID and Humalog 10 units with meals.  Just packed it up from our pharmacy less than 1 wk ago. Checks BS once a day in a.m before BF.  Before starting insulin, range was in the mid 300s.  Now in the 200 range. -no low BS; lowest has been 90 Endorses numbness in feet intermittently No blurred vision.  [place]  Wears bifocal. Does okay with eating habits, eats a lot of veggies.  Drinks mainly water or juices without sugars.   Plays volley ball daily and sometimes he runs in the park.   Patient Active Problem List   Diagnosis Date Noted   Hyponatremia 08/31/2021   Bandemia 08/31/2021   Normocytic anemia 08/31/2021   Language barrier 08/31/2021   Epigastric pain    Liver abscess 08/28/2021    Uncontrolled diabetes mellitus with hyperglycemia, without long-term current use of insulin (HCC) 08/28/2021     Current Outpatient Medications on File Prior to Visit  Medication Sig Dispense Refill   blood glucose meter kit and supplies Use up to 4 times a day as directed 1 each 0   glucose blood (TRUE METRIX BLOOD GLUCOSE TEST) test strip use up to 4 times a day as directed 200 each 0   insulin lispro (HUMALOG) 100 UNIT/ML KwikPen Inject 10 Units into the skin 3 (three) times daily. 15 mL 3   Insulin Pen Needle 32G X 6 MM MISC use 4 (four) times daily -  before meals and at bedtime. 200 each 1   TRUEplus Lancets 28G MISC use up to 4 times a day as directed 200 each 0   No current facility-administered medications on file prior to visit.    No Known Allergies  Social History   Socioeconomic History   Marital status: Married    Spouse name: Not on file   Number of children: Not on file   Years of education: Not on file   Highest education level: Not on file  Occupational History   Not on file  Tobacco Use   Smoking status: Never   Smokeless tobacco: Never  Substance and Sexual Activity   Alcohol use: Not Currently   Drug use: Never   Sexual activity: Not  on file  Other Topics Concern   Not on file  Social History Narrative   Not on file   Social Determinants of Health   Financial Resource Strain: Not on file  Food Insecurity: Not on file  Transportation Needs: Not on file  Physical Activity: Not on file  Stress: Not on file  Social Connections: Not on file  Intimate Partner Violence: Not on file    Family History  Problem Relation Age of Onset   Heart disease Neg Hx     Past Surgical History:  Procedure Laterality Date   IR RADIOLOGIST EVAL & MGMT  09/15/2021   IR RADIOLOGIST EVAL & MGMT  09/27/2021    ROS: Review of Systems Negative except as stated above  PHYSICAL EXAM: BP 111/78   Pulse (!) 106   Temp 99.1 F (37.3 C) (Oral)   Wt 152 lb 3.2 oz (69  kg)   SpO2 99%   BMI 21.84 kg/m   Physical Exam   General appearance - alert, well appearing, and in no distress Mental status - normal mood, behavior, speech, dress, motor activity, and thought processes Eyes - pupils equal and reactive, extraocular eye movements intact Chest - clear to auscultation, no wheezes, rales or rhonchi, symmetric air entry Heart - normal rate, regular rhythm, normal S1, S2, no murmurs, rubs, clicks or gallops Abdomen:  non-distended, soft, non-tender,  Extremities - peripheral pulses normal, no pedal edema, no clubbing or cyanosis Diabetic Foot Exam - Simple   Simple Foot Form Diabetic Foot exam was performed with the following findings: Yes 10/03/2021 10:24 AM  Visual Inspection See comments: Yes Sensation Testing Intact to touch and monofilament testing bilaterally: Yes Pulse Check Posterior Tibialis and Dorsalis pulse intact bilaterally: Yes Comments Flat footed.  Nails cut short.         Latest Ref Rng & Units 09/06/2021    4:27 AM 09/04/2021    4:48 AM 09/03/2021    5:11 AM  CMP  Glucose 70 - 99 mg/dL 172  171  237   BUN 6 - 20 mg/dL $Remove'9  10  12   'MfowOjR$ Creatinine 0.61 - 1.24 mg/dL 0.62  0.79  0.86   Sodium 135 - 145 mmol/L 139  137  135   Potassium 3.5 - 5.1 mmol/L 3.7  3.8  4.2   Chloride 98 - 111 mmol/L 101  98  96   CO2 22 - 32 mmol/L $RemoveB'30  31  30   'pEYfVPeo$ Calcium 8.9 - 10.3 mg/dL 9.0  9.0  9.1    Lipid Panel  No results found for: "CHOL", "TRIG", "HDL", "CHOLHDL", "VLDL", "LDLCALC", "LDLDIRECT"  CBC    Component Value Date/Time   WBC 7.6 09/06/2021 0427   RBC 4.02 (L) 09/06/2021 0427   HGB 10.6 (L) 09/06/2021 0427   HCT 33.8 (L) 09/06/2021 0427   PLT 647 (H) 09/06/2021 0427   MCV 84.1 09/06/2021 0427   MCH 26.4 09/06/2021 0427   MCHC 31.4 09/06/2021 0427   RDW 12.2 09/06/2021 0427   LYMPHSABS 2.9 09/01/2021 0432   MONOABS 1.2 (H) 09/01/2021 0432   EOSABS 0.1 09/01/2021 0432   BASOSABS 0.0 09/01/2021 0432    ASSESSMENT AND PLAN:  1.  Establishing care with new doctor, encounter for   2. Uncontrolled diabetes mellitus with hyperglycemia, with long-term current use of insulin ([place]) Patient advised to eliminate sugary drinks from the diet, cut back on portion sizes especially of white carbohydrates, eat more white lean meat like chicken [place] and  seafood instead of beef or pork and incorporate fresh fruits and vegetables into the diet daily.  Went over blood sugar goals before meals being 90-130 and 2 hours after meals being less than 180.  Increase glargine insulin to 27 units twice a day.  Continue short acting insulin with 10 units with meals.  We will check antibodies to determine whether he is type I or type II diabetic. -Follow-up with the clinical pharmacist in 2 weeks for recheck.  Advised to check blood sugars twice a day before breakfast and before dinner.  Advised to record those readings and bring it in when he comes to see the clinical pharmacist in 2 weeks. - POCT glucose (manual entry) - Microalbumin / creatinine urine ratio - Insulin Glargine (BASAGLAR KWIKPEN) 100 UNIT/ML; Inject 27 Units into the skin 2 (two) times daily.  Dispense: 15 mL; Refill: 3 - IA-2 Autoantibodies - Anti-islet cell antibody  3. Normocytic anemia May have been due to acute illness.  Will recheck CBC - CBC With Diff/Platelet  4. Liver abscess Advise pt that if he becomes febrile with or without abdominal pain, he should f/u immediately. - CBC With Diff/Platelet   [employer] Language interpreter used during this encounter. #[id number]  Patient was given the opportunity to ask questions.  Patient verbalized understanding of the plan and was able to repeat key elements of the plan.   This documentation was completed using Radio producer.  Any transcriptional errors are unintentional.  Orders Placed This Encounter  Procedures   Microalbumin / creatinine urine ratio   CBC With Diff/Platelet   IA-2 Autoantibodies    Anti-islet cell antibody   POCT glucose (manual entry)     Requested Prescriptions   Signed Prescriptions Disp Refills   Insulin Glargine (BASAGLAR KWIKPEN) 100 UNIT/ML 15 mL 3    Sig: Inject 27 Units into the skin 2 (two) times daily.    Return in about 3 months (around 01/03/2022) for Appt with [facility] in 2 wks for BS check.  Karle Plumber, MD, FACP

## 2021-10-09 LAB — CBC WITH DIFF/PLATELET
Basophils Absolute: 0 10*3/uL (ref 0.0–0.2)
Basos: 0 %
EOS (ABSOLUTE): 0 10*3/uL (ref 0.0–0.4)
Eos: 0 %
Hematocrit: 37.7 % (ref 37.5–51.0)
Hemoglobin: 12.4 g/dL — ABNORMAL LOW (ref 13.0–17.7)
Immature Grans (Abs): 0.1 10*3/uL (ref 0.0–0.1)
Immature Granulocytes: 1 %
Lymphocytes Absolute: 2.5 10*3/uL (ref 0.7–3.1)
Lymphs: 23 %
MCH: 26.9 pg (ref 26.6–33.0)
MCHC: 32.9 g/dL (ref 31.5–35.7)
MCV: 82 fL (ref 79–97)
Monocytes Absolute: 1.3 10*3/uL — ABNORMAL HIGH (ref 0.1–0.9)
Monocytes: 12 %
Neutrophils Absolute: 6.9 10*3/uL (ref 1.4–7.0)
Neutrophils: 64 %
Platelets: 453 10*3/uL — ABNORMAL HIGH (ref 150–450)
RBC: 4.61 x10E6/uL (ref 4.14–5.80)
RDW: 13 % (ref 11.6–15.4)
WBC: 10.8 10*3/uL (ref 3.4–10.8)

## 2021-10-09 LAB — MICROALBUMIN / CREATININE URINE RATIO
Creatinine, Urine: 77.5 mg/dL
Microalb/Creat Ratio: 117 mg/g creat — ABNORMAL HIGH (ref 0–29)
Microalbumin, Urine: 90.6 ug/mL

## 2021-10-09 LAB — IA-2 AUTOANTIBODIES: IA-2 Autoantibodies: <7.5 U/mL

## 2021-10-09 LAB — ANTI-ISLET CELL ANTIBODY: Islet Cell Ab: NEGATIVE

## 2021-11-04 ENCOUNTER — Ambulatory Visit: Payer: Self-pay | Attending: Internal Medicine | Admitting: Pharmacist

## 2021-11-04 ENCOUNTER — Other Ambulatory Visit: Payer: Self-pay

## 2021-11-04 ENCOUNTER — Encounter: Payer: Self-pay | Admitting: Pharmacist

## 2021-11-04 DIAGNOSIS — E1165 Type 2 diabetes mellitus with hyperglycemia: Secondary | ICD-10-CM

## 2021-11-04 DIAGNOSIS — Z794 Long term (current) use of insulin: Secondary | ICD-10-CM

## 2021-11-04 MED ORDER — BASAGLAR KWIKPEN 100 UNIT/ML ~~LOC~~ SOPN
60.0000 [IU] | PEN_INJECTOR | Freq: Every day | SUBCUTANEOUS | 2 refills | Status: DC
  Filled 2021-11-04: qty 18, 30d supply, fill #0
  Filled 2021-12-08: qty 18, 30d supply, fill #1

## 2021-11-04 NOTE — Progress Notes (Signed)
    S:     No chief complaint on file.  Adam Skinner is a 26 y.o. male who presents for diabetes evaluation, education, and management.  PMH is significant for DM type II, liver abscess, anemia.  Patient was referred and last seen by Primary Care Provider, Dr. Laural Benes, on 10/03/2021. At last visit, she ordered testing to help differentiate the type of DM pt has. Anti-islet Abs and IA-2 auto-ab tests were normal.    Today, patient arrives in good spirits and presents without any assistance.   Patient reports Diabetes was diagnosed at age 83 YO. He doesn't remember what type he was dx with, however, he was initially prescribed metformin and glimepiride. Pt was hospitalized 6/17-6/28/2023.  Found to have liver abscess. Cultures grew Klebsiella pneumonia. He was treated with antibiotics. Also found to have untreated DM type II with A1c of 12.3%. He was discharged on Basaglar 25 units BID and Humalog 10 units with meals. He denies any history of MI, stroke, CKD. Of note he does have a recent elevated urine microalbumin:Scr ratio. No history of retinopathy. He endorses occasional neuropathy.   Family/Social History:  -Fhx: no pertinent positives reported. No fhx of T1DM.  -Tobacco: never smoker  -Alcohol: none reported  Current diabetes medications include: Basaglar 27u BID, Humalog 10u TID before meals.   Patient reports adherence to taking all medications as prescribed.   Insurance coverage: none  Patient denies hypoglycemic events.  Reported home fasting blood sugars: 150s-160s  Reported 2 hour post-meal/random blood sugars: 200-230s.  Patient denies nocturia (nighttime urination).  Patient reports neuropathy (nerve pain). Patient denies visual changes. Patient reports self foot exams.   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  Lipid Panel  No results found for: "CHOL", "TRIG", "HDL", "CHOLHDL", "VLDL", "LDLCALC", "LDLDIRECT"  Clinical Atherosclerotic Cardiovascular Disease (ASCVD): No  The ASCVD Risk score (Arnett DK, et al., 2019) failed to calculate for the following reasons:   The 2019 ASCVD risk score is only valid for ages 32 to 51   A/P: Diabetes longstanding currently uncontrolled. Patient is able to verbalize appropriate hypoglycemia management plan. Medication adherence appears appropriate. His presentation is interesting. His body habitus and age are suggestive of type 1 DM, a combined Type I/II, or even LADA.Will maintain insulin as mainstay of tx.  -Condense Basaglar to once daily dosing. Increase to 60u once daily.  -Continued Humalog 10u TID before meals.  -Patient educated on purpose, proper use, and potential adverse effects of Basaglar, Humalog.  -Extensively discussed pathophysiology of diabetes, recommended lifestyle interventions, dietary effects on blood sugar control.  -Counseled on s/sx of and management of hypoglycemia.  -Next A1c anticipated 11/2021.   Written patient instructions provided. Patient verbalized understanding of treatment plan.  Total time in face to face counseling 30 minutes.    Follow-up:  Pharmacist 1 month.   Butch Penny, PharmD, P[place]y [place], CPP Clinical Pharmacist [facility] [phone]

## 2021-12-07 NOTE — Progress Notes (Unsigned)
S:     PCP: Markeise Mathews is a 26 y.o. male who presents for diabetes evaluation, education, and management.  PMH is significant for T2DM, liver abscess, and anemia.   Previously hospitalized 6/17-6/28/23 for liver abscess and was found to have untreated DM with A1c of 12.3%.   Patient was referred and last seen by Primary Care Provider, Dr. Karle Plumber, on 10/03/21. Reported improvements in BG readings from 300s to 200s at that visit. Humalog was continued and Basaglar dose was increased.   At last visit with clinical pharmacist, some concern for type 1 DM compared to type 2 DM based on his body habitus and age. Condensed Basaglar to once daily and TDD increased; all other medications the same.   Today, patient arrives in good spirits and presents without any assistance. Visit was conducted with a Optometrist.   Patient reports diabetes was diagnosed at 26 YO.   Family/Social History:  -Fhx: no pertinent positives -Tobacco: none reported -Alcohol: none reported  Current diabetes medications include: Basaglar 60 units once daily (taking 70 units), Humalog 10 units TID   Patient reports adherence to his medications as prescribed, however, he was not sure about how to get any future fills of medications. This is concerning for some possible medication non-adherence. He reported missing a total of 8 mealtime doses of his Humalog over the past month.   Do you feel that your medications are working for you? yes Have you been experiencing any side effects to the medications prescribed? no Do you have any problems obtaining medications due to transportation or finances? no Insurance coverage: none  Patient reports a single hypoglycemic event. He did not have his BG meter at the time to confirm a BG level.   Reported home fasting blood sugars: 150-170  Reported 2 hour post-meal/random blood sugars: 200.  Patient denies nocturia (nighttime urination).  Patient  denies neuropathy (nerve pain). Patient denies visual changes.  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   A/P: Diabetes longstanding currently uncontrolled. Patient is able to verbalize appropriate hypoglycemia management plan. Medication adherence appears appropriate. Control is suboptimal due to some possible medication non-adherence/confusion with regimen. A1c is down to 9.2  today from 12.3.  -Continued basal insulin Basaglar 60 units. Verified the dose with the patient.  -Continued rapid insulin Humalog 10 units once a day  -Started GLP-1 Trulicity 0.75mg  once a week  -Patient educated on purpose, proper use, and potential adverse effects of Trulicity.  -Extensively discussed pathophysiology of diabetes, recommended lifestyle interventions, dietary effects on blood sugar control.  -Counseled on s/sx of and management of hypoglycemia.  -Next A1c anticipated December/January.  -Educated on the pharmacy refill process and how he can pick up additional fills when needed. -Educated on the time frame for use of Humalog and not to take this medication if he skips a meal.  -Encouraged to bring his BG meter to his next visit to confirm his home BG readings.   Written patient instructions provided. Patient verbalized understanding of treatment plan.  Total time in face  to face counseling 30 minutes.    Follow-up:  Pharmacist in 6 weeks. PCP clinic visit in October  Irish Elders, PharmD PGY-1 [facility] Pharmacy Resident

## 2021-12-08 ENCOUNTER — Ambulatory Visit: Payer: Self-pay | Attending: Internal Medicine | Admitting: Pharmacist

## 2021-12-08 ENCOUNTER — Other Ambulatory Visit: Payer: Self-pay

## 2021-12-08 DIAGNOSIS — E1165 Type 2 diabetes mellitus with hyperglycemia: Secondary | ICD-10-CM

## 2021-12-08 DIAGNOSIS — Z794 Long term (current) use of insulin: Secondary | ICD-10-CM

## 2021-12-08 LAB — POCT GLYCOSYLATED HEMOGLOBIN (HGB A1C): HbA1c, POC (controlled diabetic range): 9.2 % — AB (ref 0.0–7.0)

## 2021-12-08 MED ORDER — TRULICITY 0.75 MG/0.5ML ~~LOC~~ SOAJ
0.7500 mg | SUBCUTANEOUS | 1 refills | Status: DC
  Filled 2021-12-08: qty 2, 28d supply, fill #0

## 2021-12-19 ENCOUNTER — Other Ambulatory Visit (HOSPITAL_COMMUNITY): Payer: Self-pay

## 2021-12-19 ENCOUNTER — Encounter (HOSPITAL_COMMUNITY): Payer: Self-pay

## 2021-12-19 ENCOUNTER — Other Ambulatory Visit: Payer: Self-pay

## 2021-12-19 ENCOUNTER — Emergency Department (HOSPITAL_COMMUNITY)
Admission: EM | Admit: 2021-12-19 | Discharge: 2021-12-19 | Disposition: A | Discharge status: Home / Self Care | Payer: Self-pay | Attending: Emergency Medicine | Admitting: Emergency Medicine

## 2021-12-19 DIAGNOSIS — E119 Type 2 diabetes mellitus without complications: Secondary | ICD-10-CM | POA: Insufficient documentation

## 2021-12-19 DIAGNOSIS — H66001 Acute suppurative otitis media without spontaneous rupture of ear drum, right ear: Secondary | ICD-10-CM | POA: Insufficient documentation

## 2021-12-19 DIAGNOSIS — Z794 Long term (current) use of insulin: Secondary | ICD-10-CM | POA: Insufficient documentation

## 2021-12-19 LAB — CBG MONITORING, ED: Glucose-Capillary: 133 mg/dL — ABNORMAL HIGH (ref 70–99)

## 2021-12-19 MED ORDER — AMOXICILLIN 500 MG PO CAPS
1000.0000 mg | ORAL_CAPSULE | Freq: Three times a day (TID) | ORAL | 0 refills | Status: AC
  Filled 2021-12-19: qty 60, 10d supply, fill #0

## 2021-12-19 NOTE — ED Provider Notes (Signed)
[facility] DEPT Provider Note   CSN: [id number] Arrival date & time: 12/19/21  0857     History  Chief Complaint  Patient presents with   Fever    otalgia   Otalgia   Dizziness    Adam Skinner is a 26 y.o. male with a past medical history of uncontrolled diabetes presenting with ear pain, fever and some dizziness that started today. He says that yesterday his right ear became painful and he began to experience subjective fevers.  Denies any drainage or problems with his vision.  This morning he woke up and felt a little bit dizzy and could not stand the pain anymore.  Has not tried any medications.  Does report that he is taking his insulin for diabetes   Fever Associated symptoms: ear pain   Otalgia Associated symptoms: fever   Dizziness      Home Medications Prior to Admission medications   Medication Sig Start Date End Date Taking? Authorizing Provider  blood glucose meter kit and supplies Use up to 4 times a day as directed 09/07/21   Wendee Beavers T, MD  Dulaglutide (TRULICITY) 6.38 VF/6.4PP SOPN Inject 0.75 mg into the skin once a week. 12/08/21   Ladell Pier, MD  glucose blood (TRUE METRIX BLOOD GLUCOSE TEST) test strip use up to 4 times a day as directed 09/07/21   Mercy Riding, MD  Insulin Glargine (BASAGLAR KWIKPEN) 100 UNIT/ML Inject 60 Units into the skin daily. 11/04/21   Ladell Pier, MD  insulin lispro (HUMALOG) 100 UNIT/ML KwikPen Inject 10 Units into the skin 3 (three) times daily. 09/07/21   Mercy Riding, MD  Insulin Pen Needle 32G X 6 MM MISC use 4 (four) times daily -  before meals and at bedtime. 09/07/21   Mercy Riding, MD  TRUEplus Lancets 28G MISC use up to 4 times a day as directed 09/07/21   Mercy Riding, MD      Allergies    Pork-derived products    Review of Systems   Review of Systems  Constitutional:  Positive for fever.  HENT:  Positive for ear pain.   Neurological:  Positive for dizziness.     Physical Exam Updated Vital Signs BP (!) 134/95 (BP Location: Right Arm)   Pulse (!) 105   Temp 98 F (36.7 C) (Oral)   Resp 16   Ht 5' 4.96" (1.65 m)   Wt 70.3 kg   SpO2 98%   BMI 25.82 kg/m  Physical Exam Vitals and nursing note reviewed.  Constitutional:      Appearance: Normal appearance.  HENT:     Head: Normocephalic and atraumatic.     Left Ear: Tympanic membrane, ear canal and external ear normal.     Ears:     Comments: Mild tragal tenderness to the right ear.  Bulging TM and erythematous canal.  No drainage.  TM intact. Eyes:     General: No scleral icterus.    Extraocular Movements: Extraocular movements intact.     Conjunctiva/sclera: Conjunctivae normal.     Pupils: Pupils are equal, round, and reactive to light.  Pulmonary:     Effort: Pulmonary effort is normal. No respiratory distress.  Skin:    Findings: No rash.  Neurological:     Mental Status: He is alert.     Comments: Cranial nerves II through XII grossly intact.  No problem with finger-nose testing.  No blurred vision, EOMs intact and PERRLA.  Psychiatric:        Mood and Affect: Mood normal.     ED Results / Procedures / Treatments   Labs (all labs ordered are listed, but only abnormal results are displayed) Labs Reviewed - No data to display  EKG None  Radiology No results found.  Procedures Procedures   Medications Ordered in ED Medications - No data to display  ED Course/ Medical Decision Making/ A&P                           Medical Decision Making  26 year old male presenting with ear discomfort.  Differential includes but is not limited to otitis media, otitis externa, vestibular neuritis, Mnire's, labyrinthitis, other URI.  Physical exam: Normal left ear, right ear suspicious of otitis media.  Work-up: History of hyperglycemia so fingerstick was obtained to ensure patient was not having a hypeglycemic incident.  This was nonconcerning  MDM/disposition:  26 year old male dizziness that occurred this morning.  I did consider vestibular neuritis however patient is not made any hearing loss.  Low likelihood of Mnire's due to patient's age, health and no problems with his hearing or vertigo.  No mastoid tenderness, doubt mastoiditis however this was considered.  He will be treated with antibiotics for an otitis media.  Given return precautions and instructed to follow-up with PCP.  Ibuprofen and Tylenol may be used for any fevers while the infection improves   Final Clinical Impression(s) / ED Diagnoses Final diagnoses:  Non-recurrent acute suppurative otitis media of right ear without spontaneous rupture of tympanic membrane    Rx / DC Orders ED Discharge Orders          Ordered    amoxicillin (AMOXIL) 500 MG capsule  3 times daily        12/19/21 0947           Results and diagnoses were explained to the patient. Return precautions discussed in full. Patient had no additional questions and expressed complete understanding.   This chart was dictated using voice recognition software.  Despite best efforts to proofread,  errors can occur which can change the documentation meaning.    Rhae Hammock, PA-C 12/19/21 [place], MD 12/19/21 1134

## 2021-12-19 NOTE — Discharge Instructions (Addendum)
Tienes una infeccin del odo medio. Tome los antibiticos segn lo prescrito. El ibuprofeno y Tylenol son buenas opciones para la fiebre. Regrese si sus sntomas empeoran, especialmente problemas con la audicin, drenaje u otros sntomas preocupantes.  De lo contrario, consulte a su PCP a finales de esta semana para evaluar si hay mejoras. Lea la informacin sobre infecciones de odo adjunta a estos documentos de alta. Tambin se adjunta su nota de trabajo. Fue Engineer, drilling conocerte y espero que te sientas mejor!  (You have a middle ear infection.  Please take the antibiotics as prescribed.  Ibuprofen and Tylenol are good options for fevers.  Please return with any worsening symptoms, especially problems with your hearing, drainage or other worrisome symptoms. Otherwise, please see your PCP later this week to evaluate for improvement.  Read the information about ear infections attached to these discharge papers.  Your work note is attached as well.  It was a pleasure to meet you and I hope you feel better!)

## 2021-12-19 NOTE — ED Triage Notes (Signed)
Patient c/o right ear pain x 2 days. Patient also reports a fever and dizziness today.

## 2021-12-19 NOTE — ED Notes (Signed)
C[facility] 133 

## 2022-01-03 ENCOUNTER — Other Ambulatory Visit: Payer: Self-pay

## 2022-01-03 ENCOUNTER — Ambulatory Visit: Payer: Self-pay | Attending: Internal Medicine | Admitting: Internal Medicine

## 2022-01-03 ENCOUNTER — Encounter: Payer: Self-pay | Admitting: Internal Medicine

## 2022-01-03 VITALS — BP 120/84 | HR 102 | Ht 66.0 in | Wt 169.8 lb

## 2022-01-03 DIAGNOSIS — E1165 Type 2 diabetes mellitus with hyperglycemia: Secondary | ICD-10-CM

## 2022-01-03 DIAGNOSIS — R809 Proteinuria, unspecified: Secondary | ICD-10-CM

## 2022-01-03 DIAGNOSIS — Z794 Long term (current) use of insulin: Secondary | ICD-10-CM

## 2022-01-03 DIAGNOSIS — R030 Elevated blood-pressure reading, without diagnosis of hypertension: Secondary | ICD-10-CM

## 2022-01-03 DIAGNOSIS — Z23 Encounter for immunization: Secondary | ICD-10-CM

## 2022-01-03 DIAGNOSIS — E1129 Type 2 diabetes mellitus with other diabetic kidney complication: Secondary | ICD-10-CM

## 2022-01-03 LAB — GLUCOSE, POCT (MANUAL RESULT ENTRY): POC Glucose: 182 mg/dl — AB (ref 70–99)

## 2022-01-03 MED ORDER — TETANUS-DIPHTH-ACELL PERTUSSIS 5-2-15.5 LF-MCG/0.5 IM SUSP: 0.5000 mL | Freq: Once | INTRAMUSCULAR | 0 refills | Status: AC

## 2022-01-03 MED ORDER — TRULICITY 0.75 MG/0.5ML ~~LOC~~ SOAJ
0.7500 mg | SUBCUTANEOUS | 6 refills | Status: DC
  Filled 2022-01-03: qty 2, 28d supply, fill #0

## 2022-01-03 MED ORDER — BASAGLAR KWIKPEN 100 UNIT/ML ~~LOC~~ SOPN
60.0000 [IU] | PEN_INJECTOR | Freq: Every day | SUBCUTANEOUS | 2 refills | Status: DC
  Filled 2022-01-03: qty 18, 30d supply, fill #0
  Filled 2022-03-02: qty 18, 30d supply, fill #1

## 2022-01-03 MED ORDER — INSULIN LISPRO (1 UNIT DIAL) 100 UNIT/ML (KWIKPEN)
10.0000 [IU] | PEN_INJECTOR | Freq: Three times a day (TID) | SUBCUTANEOUS | 3 refills | Status: DC
  Filled 2022-01-03: qty 15, 50d supply, fill #0
  Filled 2022-03-02: qty 15, 50d supply, fill #1

## 2022-01-03 NOTE — Progress Notes (Signed)
Patient ID: Adam Skinner, male    DOB: 05-03-1995  MRN: [id number]  CC: Chronic disease management.   Subjective: Adam Skinner is a 26 y.o. 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  He has seen the clinical pharmacist since then.  Glargine changed to 60 units once a day, Humalog continued at 10 units with meals and Trulicity 5.17 mg/week added.  Confirms compliance with medications.  Patient with microalbumin of 117 on urine that was done 09/2021. Checked forAnti-islet cell ab and IA-2 ab on last visit which were negative confirming dx of type 2 DM.  Medication side effects:  [x]  Yes - no N/V with Trulicity Home Monitoring?  [x]  Yes  1-2x/day.  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  He felt shaky.  Numbness of the feet? []  Yes    [x]  No Retinopathy hx? []  Yes    []  No Last eye exam: wears glasses.  Reports having had eye exam 22 days ago at [facility].  Told that he has started to have "hemorrhage or something like that in one eye."  Does not recall which eye.  Reports being told that no treatment needed at this time Comments:    Patient Active Problem List   Diagnosis Date Noted   Hyponatremia 08/31/2021   Bandemia 08/31/2021   Normocytic anemia 08/31/2021   Language barrier 08/31/2021    Epigastric pain    Liver abscess 08/28/2021   Uncontrolled diabetes mellitus with hyperglycemia, without long-term current use of insulin ([place]) 08/28/2021     Current Outpatient Medications on File Prior to Visit  Medication Sig Dispense Refill   blood glucose meter kit and supplies Use up to 4 times a day as directed 1 each 0   glucose blood (TRUE METRIX BLOOD GLUCOSE TEST) test strip use up to 4 times a day as directed 200 each 0   Insulin Pen Needle 32G X 6 MM MISC use 4 (four) times daily -  before meals and at bedtime. 200 each 1   TRUEplus Lancets 28G MISC use up to 4 times a day as directed 200 each 0   No current facility-administered medications on file prior to visit.    Allergies  Allergen Reactions   Pork-Derived Products     Social History   Socioeconomic History   Marital status: Married    Spouse name: Not on file   Number of children: Not on file   Years of education: Not on file   Highest education level: Not on file  Occupational History   Not on file  Tobacco Use   Smoking status: Never   Smokeless tobacco: Never  Vaping Use   Vaping Use: Never used  Substance and Sexual Activity  Alcohol use: Not Currently   Drug use: Never   Sexual activity: Not on file  Other Topics Concern   Not on file  Social History Narrative   Not on file   Social Determinants of Health   Financial Resource Strain: Not on file  Food Insecurity: Not on file  Transportation Needs: Not on file  Physical Activity: Not on file  Stress: Not on file  Social Connections: Not on file  Intimate Partner Violence: Not on file    Family History  Problem Relation Age of Onset   Diabetes Mother    Diabetes Father    Heart disease Neg Hx     Past Surgical History:  Procedure Laterality Date   IR RADIOLOGIST EVAL & MGMT  09/15/2021   IR RADIOLOGIST EVAL & MGMT  09/27/2021   LIVER SURGERY      ROS: Review of Systems Negative except as stated above  PHYSICAL EXAM: BP  120/84   Pulse (!) 102   Ht $R'5\' 6"'Jy$  (1.676 m)   Wt 169 lb 12.8 oz (77 kg)   SpO2 99%   BMI 27.41 kg/m   Wt Readings from Last 3 Encounters:  01/03/22 169 lb 12.8 oz (77 kg)  12/19/21 155 lb (70.3 kg)  10/03/21 152 lb 3.2 oz (69 kg)    Physical Exam   General appearance - alert, well appearing, and in no distress Mental status - normal mood, behavior, speech, dress, motor activity, and thought processes Neck - supple, no significant adenopathy Chest - clear to auscultation, no wheezes, rales or rhonchi, symmetric air entry Heart - normal rate, regular rhythm, normal S1, S2, no murmurs, rubs, clicks or gallops Extremities - peripheral pulses normal, no pedal edema, no clubbing or cyanosis     Latest Ref Rng & Units 09/06/2021    4:27 AM 09/04/2021    4:48 AM 09/03/2021    5:11 AM  CMP  Glucose 70 - 99 mg/dL 172  171  237   BUN 6 - 20 mg/dL $Remove'9  10  12   'tsnUrbP$ Creatinine 0.61 - 1.24 mg/dL 0.62  0.79  0.86   Sodium 135 - 145 mmol/L 139  137  135   Potassium 3.5 - 5.1 mmol/L 3.7  3.8  4.2   Chloride 98 - 111 mmol/L 101  98  96   CO2 22 - 32 mmol/L $RemoveB'30  31  30   'goKiPjUA$ Calcium 8.9 - 10.3 mg/dL 9.0  9.0  9.1    Lipid Panel  No results found for: "CHOL", "TRIG", "HDL", "CHOLHDL", "VLDL", "LDLCALC", "LDLDIRECT"  CBC    Component Value Date/Time   WBC 10.8 10/03/2021 1040   WBC 7.6 09/06/2021 0427   RBC 4.61 10/03/2021 1040   RBC 4.02 (L) 09/06/2021 0427   HGB 12.4 (L) 10/03/2021 1040   HCT 37.7 10/03/2021 1040   PLT 453 (H) 10/03/2021 1040   MCV 82 10/03/2021 1040   MCH 26.9 10/03/2021 1040   MCH 26.4 09/06/2021 0427   MCHC 32.9 10/03/2021 1040   MCHC 31.4 09/06/2021 0427   RDW 13.0 10/03/2021 1040   LYMPHSABS 2.5 10/03/2021 1040   MONOABS 1.2 (H) 09/01/2021 0432   EOSABS 0.0 10/03/2021 1040   BASOSABS 0.0 10/03/2021 1040    ASSESSMENT AND PLAN: 1. Type 2 diabetes mellitus with microalbuminuria, with long-term current use of insulin ([place]) -Reported blood sugars are better.  No  changes made to dose of glargine, Humalog, Trulicity today.  Encouraged him to continue healthy eating habits  and trying to move as much as he can. -Sounds like he likely has diabetic retinopathy.  Encouraged him to see an ophthalmologist for further evaluation.  I have given the names of 2 ophthalmologist in the area whom he can call to inquire into pricing.  Information provided on his discharge summary -Recheck urine microalbumin today to see if this has improved with better control of his diabetes. - POCT glucose (manual entry) - insulin lispro (HUMALOG) 100 UNIT/ML KwikPen; Inject 10 Units into the skin 3 (three) times daily.  Dispense: 15 mL; Refill: 3 - Dulaglutide (TRULICITY) 9.92 EQ/6.8TM SOPN; Inject 0.75 mg into the skin once a week.  Dispense: 2 mL; Refill: 6 - Insulin Glargine (BASAGLAR KWIKPEN) 100 UNIT/ML; Inject 60 Units into the skin daily.  Dispense: 18 mL; Refill: 2 - Microalbumin / creatinine urine ratio  2. Elevated blood-pressure reading without diagnosis of hypertension DASH diet discussed and encouraged.  3. Need for influenza vaccination Given today.  4. Need for Tdap vaccination Printed prescription given for him to take to our pharmacy downstairs to be given Tdap injection.  Addendum 01/04/2022: I received note from provider Marliss Coots do not of [facility].  Patient was seen 12/13/2021.  Patient found to have diabetic retinopathy OU mild, no macular edema.  Pt referred to an ophthalmologist.   AMN Language interpreter used during this encounter. #[id number] Mote  Patient was given the opportunity to ask questions.  Patient verbalized understanding of the plan and was able to repeat key elements of the plan.   This documentation was completed using Radio producer.  Any transcriptional errors are unintentional.  Orders Placed This Encounter  Procedures   Flu Vaccine QUAD 3mo+IM (Fluarix, Fluzone & Alfiuria Quad PF)   Microalbumin / creatinine  urine ratio   POCT glucose (manual entry)     Requested Prescriptions   Signed Prescriptions Disp Refills   insulin lispro (HUMALOG) 100 UNIT/ML KwikPen 15 mL 3    Sig: Inject 10 Units into the skin 3 (three) times daily.   Dulaglutide (TRULICITY) 9.79 GX/2.1JH SOPN 2 mL 6    Sig: Inject 0.75 mg into the skin once a week.   Insulin Glargine (BASAGLAR KWIKPEN) 100 UNIT/ML 18 mL 2    Sig: Inject 60 Units into the skin daily.   Tdap (ADACEL) 07-12-13.5 LF-MCG/0.5 injection 0.5 mL 0    Sig: Inject 0.5 mLs into the muscle once for 1 dose.    Return in about 4 months (around 05/06/2022).  Karle Plumber, MD, FACP

## 2022-01-03 NOTE — Patient Instructions (Signed)
[place] [phone] [place]   [phone]

## 2022-01-04 DIAGNOSIS — E113219 Type 2 diabetes mellitus with mild nonproliferative diabetic retinopathy with macular edema, unspecified eye: Secondary | ICD-10-CM | POA: Insufficient documentation

## 2022-01-04 DIAGNOSIS — R809 Proteinuria, unspecified: Secondary | ICD-10-CM | POA: Insufficient documentation

## 2022-01-05 LAB — MICROALBUMIN / CREATININE URINE RATIO
Creatinine, Urine: 24.1 mg/dL
Microalb/Creat Ratio: 24 mg/g creat (ref 0–29)
Microalbumin, Urine: 5.8 ug/mL

## 2022-01-18 NOTE — Progress Notes (Unsigned)
S:     No chief complaint on file.  26 y.o. male who presents for diabetes evaluation, education, and management.  PMH is significant for Diabetes Mellitus.  Patient was referred and last seen by Primary Care Provider, Dr. Laural Benes, on 10/03/21 and 01/03/22.  Seen by pharmacist on 11/04/21 and 12/08/21 At last visit, glargine remained at 60 units daily, Trulicity remained at 0.75 mg once weekly, lispro remained at 10 units TID with meals. His basaglar has previously been converted from 27 units BID to 60 units daily. Discovered that he might have retinopathy and he was referred to an ophthalmologist. Microalbumin-creatine ratio had been elevated at 117 on 10/03/21, with better control of his diabetes that has come down to 24 on 01/03/22. A1c down to 9.2 on 12/08/21 from 12.3 on 08/28/21. Next A1c due in December/January.  Today, patient arrives in *** good spirits and presents without *** any assistance. ***  Patient reports Diabetes was diagnosed in ***.   Family/Social History: ***  Current diabetes medications include: Trulicity 0.75 mg weekly, glargine 60 units daily, lispro 10 units TIDAC Current hypertension medications include: None Current hyperlipidemia medications include: None  Patient reports adherence to taking all medications as prescribed.  *** Patient denies adherence with medications, reports missing *** medications *** times per week, on average.  Do you feel that your medications are working for you? {YES NO:22349} Have you been experiencing any side effects to the medications prescribed? {YES NO:22349} Do you have any problems obtaining medications due to transportation or finances? {YES [id number] Insurance coverage: ***  Patient {Actions; denies-reports:120008} hypoglycemic events.  Reported home fasting blood sugars: ***  Reported 2 hour post-meal/random blood sugars: ***.  Patient {Actions; denies-reports:120008} nocturia (nighttime urination).  Patient  {Actions; denies-reports:120008} neuropathy (nerve pain). Patient {Actions; denies-reports:120008} visual changes. Patient {Actions; denies-reports:120008} self foot exams.   Patient reported dietary habits: Eats *** meals/day Breakfast: *** Lunch: *** Dinner: *** Snacks: *** Drinks: ***  Within the past 12 months, did you worry whether your food would run out before you got money to buy more? {YES NO:22349} Within the past 12 months, did the food you bought run out, and you didn't have money to get more? {YES NO:22349} PHQ-9 Score: ***  Patient-reported exercise habits: ***   O:   7 day average blood glucose: ***  *** CGM Download:  % Time CGM is active: ***% Average Glucose: *** mg/dL Glucose Management Indicator: ***  Glucose Variability: *** (goal <36%) Time in Goal:  - Time in range 70-180: ***% - Time above range: ***% - Time below range: ***% Observed patterns:   Lab Results  Component Value Date   HGBA1C 9.2 (A) 12/08/2021   There were no vitals filed for this visit.  Lipid Panel  No results found for: "CHOL", "TRIG", "HDL", "CHOLHDL", "VLDL", "LDLCALC", "LDLDIRECT"  Clinical Atherosclerotic Cardiovascular Disease (ASCVD): {YES/NO:21197} The ASCVD Risk score (Arnett DK, et al., 2019) failed to calculate for the following reasons:   The 2019 ASCVD risk score is only valid for ages 105 to 61   Patient is participating in a Managed Medicaid Plan:  {MM YES/NO:27447::"Yes"}   A/P: Diabetes longstanding *** currently ***. Patient is *** able to verbalize appropriate hypoglycemia management plan. Medication adherence appears ***. Control is suboptimal due to ***. -{Meds adjust:18428} basal insulin *** (insulin ***). Patient will continue to titrate 1 unit every *** days if fasting blood sugar > 100mg /dl until fasting blood sugars reach goal or next visit.  -{Meds  adjust:18428} rapid insulin *** (insulin ***) to ***.  -{Meds adjust:18428} GLP-1 *** (generic ***) to  ***.  -{Meds adjust:18428} SGLT2-I *** (generic ***) to ***. Counseled on sick day rules. -{Meds adjust:18428} metformin *** to ***.  -Patient educated on purpose, proper use, and potential adverse effects of ***.  -Extensively discussed pathophysiology of diabetes, recommended lifestyle interventions, dietary effects on blood sugar control.  -Counseled on s/sx of and management of hypoglycemia.  -Next A1c anticipated ***.   ASCVD risk - primary ***secondary prevention in patient with diabetes. Last LDL is *** not at goal of <67 *** mg/dL. ASCVD risk factors include *** and 10-year ASCVD risk score of ***. {Desc; low/moderate/high:110033} intensity statin indicated.  -{Meds adjust:18428} ***statin *** mg.   Hypertension longstanding *** currently ***. Blood pressure goal of <130/80 *** mmHg. Medication adherence ***. Blood pressure control is suboptimal due to ***. -***  Written patient instructions provided. Patient verbalized understanding of treatment plan.  Total time in face to face counseling *** minutes.    Follow-up:  Pharmacist ***. PCP clinic visit in ***.  Patient seen with ***.

## 2022-01-19 ENCOUNTER — Other Ambulatory Visit: Payer: Self-pay

## 2022-01-19 ENCOUNTER — Ambulatory Visit: Payer: Self-pay | Attending: Internal Medicine | Admitting: Pharmacist

## 2022-01-19 DIAGNOSIS — Z794 Long term (current) use of insulin: Secondary | ICD-10-CM

## 2022-01-19 DIAGNOSIS — R809 Proteinuria, unspecified: Secondary | ICD-10-CM

## 2022-01-19 DIAGNOSIS — E1129 Type 2 diabetes mellitus with other diabetic kidney complication: Secondary | ICD-10-CM

## 2022-01-19 MED ORDER — TRULICITY 1.5 MG/0.5ML ~~LOC~~ SOAJ
1.5000 mg | SUBCUTANEOUS | 3 refills | Status: DC
  Filled 2022-01-19: qty 2, 28d supply, fill #0
  Filled 2022-03-02: qty 2, 28d supply, fill #1
  Filled 2022-05-11: qty 2, 28d supply, fill #2
  Filled 2022-06-09: qty 2, 28d supply, fill #3

## 2022-01-19 MED ORDER — FREESTYLE LIBRE 2 SENSOR MISC: 2 refills | Status: DC

## 2022-01-19 MED ORDER — FREESTYLE LIBRE 2 READER DEVI: 0 refills | Status: DC

## 2022-01-27 ENCOUNTER — Other Ambulatory Visit: Payer: Self-pay

## 2022-01-27 MED ORDER — ADACEL 5-2-15.5 LF-MCG/0.5 IM SUSP: 0.5000 mL | Freq: Once | INTRAMUSCULAR | 0 refills | Status: AC

## 2022-01-31 ENCOUNTER — Ambulatory Visit: Payer: Self-pay

## 2022-03-02 ENCOUNTER — Other Ambulatory Visit: Payer: Self-pay

## 2022-03-02 ENCOUNTER — Ambulatory Visit: Payer: Self-pay | Attending: Internal Medicine | Admitting: Internal Medicine

## 2022-03-02 VITALS — BP 132/86 | HR 90 | Temp 98.0°F | Ht 66.0 in | Wt 168.0 lb

## 2022-03-02 DIAGNOSIS — Z794 Long term (current) use of insulin: Secondary | ICD-10-CM

## 2022-03-02 DIAGNOSIS — E1129 Type 2 diabetes mellitus with other diabetic kidney complication: Secondary | ICD-10-CM

## 2022-03-02 DIAGNOSIS — E08319 Diabetes mellitus due to underlying condition with unspecified diabetic retinopathy without macular edema: Secondary | ICD-10-CM

## 2022-03-02 DIAGNOSIS — H6690 Otitis media, unspecified, unspecified ear: Secondary | ICD-10-CM

## 2022-03-02 DIAGNOSIS — R809 Proteinuria, unspecified: Secondary | ICD-10-CM

## 2022-03-02 LAB — POCT GLYCOSYLATED HEMOGLOBIN (HGB A1C): HbA1c, POC (controlled diabetic range): 6.6 % (ref 0.0–7.0)

## 2022-03-02 LAB — GLUCOSE, POCT (MANUAL RESULT ENTRY): POC Glucose: 180 mg/dl — AB (ref 70–99)

## 2022-03-02 MED ORDER — CEPHALEXIN 500 MG PO CAPS
500.0000 mg | ORAL_CAPSULE | Freq: Four times a day (QID) | ORAL | 0 refills | Status: DC
  Filled 2022-03-02: qty 28, 7d supply, fill #0

## 2022-03-02 NOTE — Progress Notes (Signed)
Patient ID: Adam Skinner, male    DOB: 09/10/1995  MRN: [id number]  CC: Diabetes (Dm f/u. Med refills. /Pimple/blackhead in L ear, pain X4 days./Already received flu vax this season.)   Subjective: Adam Skinner is a 26 y.o. 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  Did not qualify to get it through PAP  DM: Results for orders placed or performed in visit on 03/02/22  POCT glucose (manual entry)  Result Value Ref Range   POC Glucose 180 (A) 70 - 99 mg/dl  POCT glycosylated hemoglobin (Hb A1C)  Result Value Ref Range   Hemoglobin A1C     HbA1c POC (<> result, manual entry)     HbA1c, POC (prediabetic range)     HbA1c, POC (controlled diabetic range) 6.6 0.0 - 7.0 %  -BS before BF 120-130; bedtime range 150-170 Compliant with Glargine 60 units daily, Novolog 10 units TID meals, Trulicity 1.5 mg once a wk.  No vomiting, chronic diarrhea or abdominal pain on Trulicity.   Informed that I received report from [facility] indicating he has mild DM retinopathy OU without edema.  Hopes to have insurance next mth  Concern about a pimple on LT ear.   Present x 3 days.   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 E11.29 1 each 0   Continuous Blood Gluc Sensor (FREESTYLE LIBRE 2 SENSOR) MISC Use to check blood sugar at least three times daily. Change sensors once every 14 days. E11.29 2 each 2   Dulaglutide (TRULICITY) 1.5 SA/6.3KZ SOPN Inject 1.5 mg into the skin once a week. 2 mL 3   glucose blood (TRUE METRIX BLOOD GLUCOSE TEST) test strip use up to 4 times a day as directed 200 each 0   Insulin Glargine (BASAGLAR KWIKPEN) 100 UNIT/ML Inject 60 Units into the skin daily. 18 mL 2   insulin lispro (HUMALOG) 100 UNIT/ML KwikPen Inject 10 Units into the skin 3 (three) times daily. 15 mL 3   Insulin Pen Needle 32G X 6 MM MISC use 4 (four) times daily -  before meals and at bedtime. 200 each 1   TRUEplus Lancets 28G MISC use up to 4 times a day as directed 200 each 0   No current facility-administered medications on file prior to visit.    Allergies  Allergen Reactions   Pork-Derived Products     Social History   Socioeconomic History   Marital status: Married    Spouse name: Not on file   Number of children: Not on file   Years of education: Not on file   Highest education level: Not  on file  Occupational History   Not on file  Tobacco Use   Smoking status: Never   Smokeless tobacco: Never  Vaping Use   Vaping Use: Never used  Substance and Sexual Activity   Alcohol use: Not Currently   Drug use: Never   Sexual activity: Not on file  Other Topics Concern   Not on file  Social History Narrative   Not on file   Social Determinants of Health   Financial Resource Strain: Not on file  Food Insecurity: Not on file  Transportation Needs: Not on file  Physical Activity: Not on file  Stress: Not on file  Social Connections: Not on file  Intimate Partner Violence: Not on file    Family History  Problem Relation Age of Onset   Diabetes Mother    Diabetes Father    Heart disease Neg Hx     Past Surgical History:  Procedure  Laterality Date   IR RADIOLOGIST EVAL & MGMT  09/15/2021   IR RADIOLOGIST EVAL & MGMT  09/27/2021   LIVER SURGERY      ROS: Review of Systems Negative except as stated above  PHYSICAL EXAM: BP 132/86 (BP Location: Left Arm, Patient Position: Sitting, Cuff Size: Normal)   Pulse 90   Temp 98 F (36.7 C) (Oral)   Ht _0  (1.676 m)   Wt 168 lb (76.2 kg)   SpO2 99%   BMI 27.12 kg/m   Wt Readings from Last 3 Encounters:  03/02/22 168 lb (76.2 kg)  01/03/22 169 lb 12.8 oz (77 kg)  12/19/21 155 lb (70.3 kg)    Physical Exam   General appearance - alert, well appearing, and in no distress Mental status - normal mood, behavior, speech, dress, motor activity, and thought processes Ears -small slightly erythematous tender pimple at the opening to the left ear canal posterior wall. Chest - clear to auscultation, no wheezes, rales or rhonchi, symmetric air entry Heart - normal rate, regular rhythm, normal S1, S2, no murmurs, rubs, clicks or gallops     Latest Ref Rng & Units 09/06/2021    4:27 AM 09/04/2021    4:48 AM 09/03/2021    5:11 AM  CMP  Glucose 70 - 99 mg/dL 172  171  237   BUN 6 - 20 mg/dL _1 Creatinine 0.61 - 1.24 mg/dL 0.62  0.79  0.86   Sodium 135 - 145 mmol/L 139  137  135   Potassium 3.5 - 5.1 mmol/L 3.7  3.8  4.2   Chloride 98 - 111 mmol/L 101  98  96   CO2 22 - 32 mmol/L _2 Calcium 8.9 - 10.3 mg/dL 9.0  9.0  9.1    Lipid Panel  No results found for: "CHOL", "TRIG", "HDL", "CHOLHDL", "VLDL", "LDLCALC", "LDLDIRECT"  CBC    Component Value Date/Time   WBC 10.8 10/03/2021 1040   WBC 7.6 09/06/2021 0427   RBC 4.61 10/03/2021 1040   RBC 4.02 (L) 09/06/2021 0427   HGB 12.4 (L) 10/03/2021 1040   HCT 37.7 10/03/2021 1040   PLT 453 (H) 10/03/2021 1040   MCV 82 10/03/2021 1040   MCH 26.9 10/03/2021 1040   MCH 26.4 09/06/2021 0427   MCHC 32.9 10/03/2021 1040   MCHC 31.4 09/06/2021 0427   RDW 13.0 10/03/2021 1040   LYMPHSABS 2.5 10/03/2021  1040   MONOABS 1.2 (H) 09/01/2021 0432   EOSABS 0.0 10/03/2021 1040  BASOSABS 0.0 10/03/2021 1040    ASSESSMENT AND PLAN:  1. Type 2 diabetes mellitus with microalbuminuria, with long-term current use of insulin ([facility]) At goal. Patient to continue Basaglar insulin 60 units daily, Humalog 10 units 3 times a day with meals, and Trulicity 1.5 mg once a week. Continue to monitor blood sugars.  He is uninsured.  Once he has insurance we can try to get him a continuous glucose monitor. - POCT glucose (manual entry) - POCT glycosylated hemoglobin (Hb A1C)  2. 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  3. Ear infection Looks like this pimple is likely infected.  Given that he is a diabetic we will err on the side of caution and put him on a course of Keflex. - cephALEXin (KEFLEX) 500 MG capsule; Take 1 capsule (500 mg total) by mouth 4 (four) times daily.  Dispense: 28 capsule; Refill: 0   AMN Language interpreter used during this encounter. #[id number], Camila  Patient was given the opportunity to ask questions.  Patient verbalized understanding of the plan and was able to repeat key elements of the plan.   This documentation was completed using Radio producer.  Any transcriptional errors are unintentional.  Orders Placed This Encounter  Procedures   POCT glucose (manual entry)   POCT glycosylated hemoglobin (Hb A1C)     Requested Prescriptions    No prescriptions requested or ordered in this encounter    No follow-ups on file.  Karle Plumber, MD, FACP

## 2022-04-06 ENCOUNTER — Other Ambulatory Visit (HOSPITAL_COMMUNITY): Payer: Self-pay

## 2022-04-06 ENCOUNTER — Ambulatory Visit (HOSPITAL_COMMUNITY)
Admission: EM | Admit: 2022-04-06 | Discharge: 2022-04-06 | Disposition: A | Discharge status: Home / Self Care | Payer: Self-pay | Attending: Internal Medicine | Admitting: Internal Medicine

## 2022-04-06 ENCOUNTER — Encounter (HOSPITAL_COMMUNITY): Payer: Self-pay

## 2022-04-06 DIAGNOSIS — L0201 Cutaneous abscess of face: Secondary | ICD-10-CM

## 2022-04-06 MED ORDER — DOXYCYCLINE HYCLATE 100 MG PO CAPS
100.0000 mg | ORAL_CAPSULE | Freq: Two times a day (BID) | ORAL | 0 refills | Status: AC
  Filled 2022-04-06: qty 14, 7d supply, fill #0

## 2022-04-06 NOTE — Discharge Instructions (Addendum)
El absceso en su cara parece infectado. [place] prximos 7 das. Puede tomar ibuprofeno 600 mg (3 tabletas de 200 mg de venta libre) cada 6 horas segn sea necesario para el dolor en el rea. Aplique compresas tibias al absceso para permitir que drene y sane.  Si desarrolla algn sntoma nuevo o que empeora o no mejora en los prximos 2 a 3 das, regrese. Si sus sntomas son graves, acuda a la sala de Multimedia programmer. Haga un seguimiento con su proveedor de atencin primaria para una evaluacin y control adicionales de sus sntomas, as como visitas de Therapist, sports continuas. Espero que se sienta mejor!  The abscess on your face looks infected. Please take doxycycline twice daily for the next 7 days. You may take ibuprofen 600mg  (3 over the counter 200mg  tablets) every 6 hours as needed for pain to the area.  Apply warm compresses to the abscess to allow the abscess to drain and heal.   If you develop any new or worsening symptoms or do not improve in the next 2 to 3 days, please return.  If your symptoms are severe, please go to the emergency room.  Follow-up with your primary care provider for further evaluation and management of your symptoms as well as ongoing wellness visits.  I hope you feel better!

## 2022-04-06 NOTE — ED Triage Notes (Signed)
Chief Complaint: facial swelling and abscess on the right side of the face/mouth. No known tooth infections or broken teeth. No oral trauma.   Onset: 3 days   Prescriptions or OTC medications tried: Yes- tylenol     with mild relief

## 2022-04-06 NOTE — ED Provider Notes (Addendum)
[place]    CSN: [id number] Arrival date & time: 04/06/22  1127      History   Chief Complaint Chief Complaint  Patient presents with   Abscess    HPI Adam Skinner is a 27 y.o. male.   Patient presents to urgent care for evaluation of abscess to the right jaw that started out as a "pimple" to the face but has grown significantly in size and pain over the last few days. He reports chills without known documented fever at home. History of type 2 diabetes, states he takes his medicines regularly. Abscess has become red, very painful, and swollen. He denies drainage from the abscess, recent trauma to the area, recent antibiotic or steroid use, sore throat, headache, ear pain, and fatigue. He has had an abscess like this in the past.   The history is provided by the patient. A language interpreter was used (Patient's friend at bedside provided interpretation with patient's consent- offered medical interpreter, patient declined).    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 Authorizing Provider  doxycycline (VIBRAMYCIN) 100 MG capsule Take 1 capsule (100 mg total) by mouth 2 (two) times daily for 7 days. 04/06/22 04/13/22 Yes StanhopeStasia Cavalier, FNP  Dulaglutide (TRULICITY) 1.5  BS/4.9QP SOPN Inject 1.5 mg into the skin once a week. 01/19/22  Yes Ladell Pier, MD  Insulin Glargine [facility] KWIKPEN) 100 UNIT/ML Inject 60 Units into the skin daily. 01/03/22  Yes Ladell Pier, MD  insulin lispro (HUMALOG) 100 UNIT/ML KwikPen Inject 10 Units into the skin 3 (three) times daily. 01/03/22  Yes Ladell Pier, MD  blood glucose meter kit and supplies Use up to 4 times a day as directed 09/07/21   Mercy Riding, MD  cephALEXin (KEFLEX) 500 MG capsule Take 1 capsule (500 mg total) by mouth 4 (four) times daily. 03/02/22   Ladell Pier, MD  Continuous Blood Gluc Receiver (FREESTYLE LIBRE 2 READER) DEVI Use to check blood sugar at least three times daily. E11.29 01/19/22   Ladell Pier, MD  Continuous Blood Gluc Sensor (FREESTYLE LIBRE 2 SENSOR) MISC Use to check blood sugar at least three times daily. Change sensors once every 14 days. E11.29 01/19/22   Ladell Pier, MD  glucose blood (TRUE METRIX BLOOD GLUCOSE TEST) test strip use up to 4 times a day as directed 09/07/21   Mercy Riding, MD  Insulin Pen Needle 32G X 6 MM MISC use 4 (four) times daily -  before meals and at bedtime. 09/07/21   Mercy Riding, MD  TRUEplus Lancets 28G MISC use up to 4 times a day as directed 09/07/21   Mercy Riding, MD    Family  History Family History  Problem Relation Age of Onset   Diabetes Mother    Diabetes Father    Heart disease Neg Hx     Social History Social History   Tobacco Use   Smoking status: Never   Smokeless tobacco: Never  Vaping Use   Vaping Use: Never used  Substance Use Topics   Alcohol use: Not Currently   Drug use: Never     Allergies   Pork-derived products   Review of Systems Review of Systems Per HPI  Physical Exam Triage Vital Signs ED Triage Vitals  Enc Vitals Group     BP 04/06/22 1247 119/79     Pulse Rate 04/06/22 1247 94     Resp 04/06/22 1247 16     Temp 04/06/22 1247 98.4 F (36.9 C)     Temp Source 04/06/22  1247 Oral     SpO2 04/06/22 1247 98 %     Weight 04/06/22 1246 167 lb 15.9 oz (76.2 kg)     Height 04/06/22 1246 5\' 6"  (1.676 m)     Head Circumference --      Peak Flow --      Pain Score 04/06/22 1244 7     Pain Loc --      Pain Edu? --      Excl. in GC? --    No data found.  Updated Vital Signs BP 119/79 (BP Location: Left Arm)   Pulse 94   Temp 98.4 F (36.9 C) (Oral)   Resp 16   Ht 5\' 6"  (1.676 m)   Wt 167 lb 15.9 oz (76.2 kg)   SpO2 98%   BMI 27.11 kg/m   Visual Acuity Right Eye Distance:   Left Eye Distance:   Bilateral Distance:    Right Eye Near:   Left Eye Near:    Bilateral Near:     Physical Exam Vitals and nursing note reviewed.  Constitutional:      Appearance: He is not ill-appearing or toxic-appearing.  HENT:     Head: Normocephalic and atraumatic.     Jaw: There is normal jaw occlusion.      Right Ear: Hearing and external ear normal.     Left Ear: Hearing and external ear normal.     Nose: Nose normal.     Mouth/Throat:     Lips: Pink.     Mouth: Mucous membranes are moist. No injury or oral lesions.     Palate: No mass and lesions.     Pharynx: Oropharynx is clear. Uvula midline. No pharyngeal swelling, oropharyngeal exudate, posterior oropharyngeal erythema or uvula swelling.     Tonsils: No tonsillar exudate or tonsillar abscesses. 0 on the right. 0 on the left.  Eyes:     General: Lids are normal. Vision grossly intact. Gaze aligned appropriately.     Extraocular Movements: Extraocular movements intact.     Conjunctiva/sclera: Conjunctivae normal.  Pulmonary:     Effort: Pulmonary effort is normal.  Musculoskeletal:     Cervical back: Neck supple.  Skin:    General: Skin is warm and dry.     Capillary Refill: Capillary refill takes less than 2 seconds.     Findings: No rash.  Neurological:     General: No focal deficit present.     Mental Status: He is alert and oriented to person, place, and time. Mental status is at baseline.      Cranial Nerves: No dysarthria or facial asymmetry.  Psychiatric:  Mood and Affect: Mood normal.        Speech: Speech normal.        Behavior: Behavior normal.        Thought Content: Thought content normal.        Judgment: Judgment normal.      UC Treatments / Results  Labs (all labs ordered are listed, but only abnormal results are displayed) Labs Reviewed - No data to display  EKG   Radiology No results found.  Procedures Procedures (including critical care time)  Medications Ordered in UC Medications - No data to display  Initial Impression / Assessment and Plan / UC Course  I have reviewed the triage vital signs and the nursing notes.  Pertinent labs & imaging results that were available during my care of the patient were reviewed by me and considered in my medical decision making (see chart for details).   1.  Cutaneous abscess of the face Abscess appears immature and infected.  Will treat with doxycycline twice daily for the next 7 days with food.  Advised to use probiotic yogurt should he find himself experiencing diarrhea related to antibiotic use.  Warm compresses advised to reduce swelling and inflammation.  Abscess may begin to drain at home.  If the abscess grows significantly in size or he notices any new or worsening signs of infection, he is to return to urgent care to have the abscess reevaluated and incised and drained.  He is agreeable with this plan.   Discussed physical exam and available lab work findings in clinic with patient.  Counseled patient regarding appropriate use of medications and potential side effects for all medications recommended or prescribed today. Discussed red flag signs and symptoms of worsening condition,when to call the PCP office, return to urgent care, and when to seek higher level of care in the emergency department. Patient verbalizes understanding and agreement with plan. All questions answered. Patient discharged in  stable condition.    Final Clinical Impressions(s) / UC Diagnoses   Final diagnoses:  Cutaneous abscess of face     Discharge Instructions      El absceso en su cara parece infectado. [place] prximos 7 das. Puede tomar ibuprofeno 600 mg (3 tabletas de 200 mg de venta libre) cada 6 horas segn sea necesario para el dolor en el rea. Aplique compresas tibias al absceso para permitir que drene y sane.  Si desarrolla algn sntoma nuevo o que empeora o no mejora en los prximos 2 a 3 das, regrese. Si sus sntomas son graves, acuda a la sala de Multimedia programmer. Haga un seguimiento con su proveedor de atencin primaria para una evaluacin y control adicionales de sus sntomas, as como visitas de Therapist, sports continuas. Espero que se sienta mejor!  The abscess on your face looks infected. Please take doxycycline twice daily for the next 7 days. You may take ibuprofen 600mg  (3 over the counter 200mg  tablets) every 6 hours as needed for pain to the area.  Apply warm compresses to the abscess to allow the abscess to drain and heal.   If you develop any new or worsening symptoms or do not improve in the next 2 to 3 days, please return.  If your symptoms are severe, please go to the emergency room.  Follow-up with your primary care provider for further evaluation and management of your symptoms as well as ongoing wellness visits.  I hope you feel better!    ED Prescriptions  Medication Sig Dispense Auth. Provider   doxycycline (VIBRAMYCIN) 100 MG capsule Take 1 capsule (100 mg total) by mouth 2 (two) times daily for 7 days. 14 capsule Carlisle Beers, FNP      PDMP not reviewed this encounter.   Carlisle Beers, FNP 04/06/22 1330    Carlisle Beers, FNP 04/06/22 1331    Carlisle Beers, [place] 04/06/22 1331

## 2022-05-08 ENCOUNTER — Other Ambulatory Visit (HOSPITAL_COMMUNITY): Payer: Self-pay

## 2022-05-08 ENCOUNTER — Ambulatory Visit: Payer: Self-pay | Attending: Internal Medicine | Admitting: Internal Medicine

## 2022-05-08 ENCOUNTER — Encounter: Payer: Self-pay | Admitting: Internal Medicine

## 2022-05-08 VITALS — BP 150/90 | HR 126 | Ht 66.0 in | Wt 170.0 lb

## 2022-05-08 DIAGNOSIS — R030 Elevated blood-pressure reading, without diagnosis of hypertension: Secondary | ICD-10-CM

## 2022-05-08 DIAGNOSIS — R809 Proteinuria, unspecified: Secondary | ICD-10-CM

## 2022-05-08 DIAGNOSIS — Z794 Long term (current) use of insulin: Secondary | ICD-10-CM

## 2022-05-08 DIAGNOSIS — E1129 Type 2 diabetes mellitus with other diabetic kidney complication: Secondary | ICD-10-CM

## 2022-05-08 MED ORDER — BASAGLAR KWIKPEN 100 UNIT/ML ~~LOC~~ SOPN
60.0000 [IU] | PEN_INJECTOR | Freq: Every day | SUBCUTANEOUS | 2 refills | Status: DC
  Filled 2022-05-08 – 2022-05-11 (×2): qty 18, 30d supply, fill #0

## 2022-05-08 MED ORDER — INSULIN LISPRO (1 UNIT DIAL) 100 UNIT/ML (KWIKPEN)
10.0000 [IU] | PEN_INJECTOR | Freq: Three times a day (TID) | SUBCUTANEOUS | 3 refills | Status: DC
  Filled 2022-05-08 – 2022-05-11 (×2): qty 15, 50d supply, fill #0

## 2022-05-08 NOTE — Progress Notes (Signed)
Patient ID: Adam Skinner, male    DOB: Nov 27, 1995  MRN: [id number]  CC: Diabetes (DM f/u. Med refill. )   Subjective: Adam Skinner is a 27 y.o. 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  Checks BS at bedtime.  Gives range 187 No low BS. No getting in meals on times due to work schedule Await him getting insurance so he can see ophthalmologist.  Does not feel his vision is getting worse.  Feels he does not have vision issue at all Needs RF on meds BP elevated today.  Nl on  UC visit 03/27/22.  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 28 capsule 0   Continuous Blood Gluc Receiver (FREESTYLE LIBRE 2 READER) DEVI Use to check blood sugar at least three times daily. E11.29 1 each 0   Continuous Blood Gluc Sensor (FREESTYLE LIBRE 2 SENSOR) MISC Use to check blood sugar at least  three times daily. Change sensors once every 14 days. E11.29 2 each 2   Dulaglutide (TRULICITY) 1.5 0000000 SOPN Inject 1.5 mg into the skin once a week. 2 mL 3   glucose blood (TRUE METRIX BLOOD GLUCOSE TEST) test strip use up to 4 times a day as directed 200 each 0   Insulin Glargine (BASAGLAR KWIKPEN) 100 UNIT/ML Inject 60 Units into the skin daily. 18 mL 2   insulin lispro (HUMALOG) 100 UNIT/ML KwikPen Inject 10 Units into the skin 3 (three) times daily. 15 mL 3   Insulin Pen Needle 32G X 6 MM MISC use 4 (four) times daily -  before meals and at bedtime. 200 each 1   TRUEplus Lancets 28G MISC use up to 4 times a day as directed 200 each 0   No current facility-administered medications on file prior to visit.    Allergies  Allergen Reactions   Pork-Derived Products     Social History   Socioeconomic History   Marital status: Married    Spouse name: Not on file   Number of children: Not on file   Years of education: Not on file   Highest education level: Not on file  Occupational History   Not on file  Tobacco Use   Smoking status: Never   Smokeless tobacco: Never  Vaping Use   Vaping Use: Never  used  Substance and Sexual Activity   Alcohol use: Not Currently   Drug use: Never   Sexual activity: Not on file  Other Topics Concern   Not on file  Social History Narrative   Not on file   Social Determinants of Health   Financial Resource Strain: Not on file  Food Insecurity: Not on file  Transportation Needs: Not on file  Physical Activity: Not on file  Stress: Not on file  Social Connections: Not on file  Intimate Partner Violence: Not on file    Family History  Problem Relation Age of Onset   Diabetes Mother    Diabetes Father    Heart disease Neg Hx     Past Surgical History:  Procedure Laterality Date   IR RADIOLOGIST EVAL & MGMT  09/15/2021   IR RADIOLOGIST EVAL & MGMT  09/27/2021   LIVER SURGERY      ROS: Review of Systems Negative except as  stated above  PHYSICAL EXAM: BP (!) 150/90   Pulse (!) 126   Ht '5\' 6"'$  (1.676 m)   Wt 170 lb (77.1 kg)   SpO2 100%   BMI 27.44 kg/m   Wt Readings from Last 3 Encounters:  05/08/22 170 lb (77.1 kg)  04/06/22 167 lb 15.9 oz (76.2 kg)  03/02/22 168 lb (76.2 kg)    Physical Exam   General appearance - alert, well appearing, and in no distress.   Mental status - pt anxiously looking at and using his phone during the entire visit. Chest - clear to auscultation, no wheezes, rales or rhonchi, symmetric air entry Heart - Tachy but regular Extremities - peripheral pulses normal, no pedal edema, no clubbing or cyanosis     Latest Ref Rng & Units 09/06/2021    4:27 AM 09/04/2021    4:48 AM 09/03/2021    5:11 AM  CMP  Glucose 70 - 99 mg/dL 172  171  237   BUN 6 - 20 mg/dL '9  10  12   '$ Creatinine 0.61 - 1.24 mg/dL 0.62  0.79  0.86   Sodium 135 - 145 mmol/L 139  137  135   Potassium 3.5 - 5.1 mmol/L 3.7  3.8  4.2   Chloride 98 - 111 mmol/L 101  98  96   CO2 22 - 32 mmol/L '30  31  30   '$ Calcium 8.9 - 10.3 mg/dL 9.0  9.0  9.1    Lipid Panel  No results found for: "CHOL", "TRIG", "HDL", "CHOLHDL", "VLDL", "LDLCALC", "LDLDIRECT"  CBC    Component Value Date/Time   WBC 10.8 10/03/2021 1040   WBC 7.6 09/06/2021 0427   RBC 4.61 10/03/2021 1040   RBC 4.02 (L) 09/06/2021 0427   HGB 12.4 (L) 10/03/2021 1040   HCT 37.7 10/03/2021 1040   PLT 453 (H) 10/03/2021 1040   MCV 82 10/03/2021 1040   MCH 26.9 10/03/2021 1040   MCH 26.4 09/06/2021 0427   MCHC 32.9 10/03/2021 1040   MCHC 31.4 09/06/2021 0427   RDW 13.0 10/03/2021 1040   LYMPHSABS 2.5 10/03/2021 1040   MONOABS 1.2 (H) 09/01/2021 0432   EOSABS 0.0 10/03/2021 1040   BASOSABS 0.0 10/03/2021 1040    ASSESSMENT AND PLAN: 1. Type 2 diabetes mellitus with microalbuminuria, with long-term current use of insulin ([facility]) Patient stable on Basaglar 60 units daily, Humalog 10 units with meals and Trulicity 1.5 mg once a week.  He will  continue current medications.  Refills sent.  2. Elevated  blood-pressure reading without diagnosis of hypertension Blood pressure notably elevated today.  Patient is very anxious.  He tells me that he is being scammed and the perpetrators are trying to get money out of him.  He plans to go to the police station when he leaves here today.  He is agreeable to following up with me in several weeks for repeat blood pressure check    [employer] Language interpreter used during this encounter. #[place]ter [id number]  Patient was given the opportunity to ask questions.  Patient verbalized understanding of the plan and was able to repeat key elements of the plan.   This documentation was completed using Radio producer.  Any transcriptional errors are unintentional.  No orders of the defined types were placed in this encounter.    Requested Prescriptions    No prescriptions requested or ordered in this encounter    Return in about 4 months (around 09/06/2022) for Appt with [facility] in 4 wks for BP check.  Karle Plumber, MD, FACP

## 2022-05-09 ENCOUNTER — Other Ambulatory Visit (HOSPITAL_COMMUNITY): Payer: Self-pay

## 2022-05-11 ENCOUNTER — Other Ambulatory Visit: Payer: Self-pay

## 2022-05-16 ENCOUNTER — Other Ambulatory Visit (HOSPITAL_COMMUNITY): Payer: Self-pay

## 2022-05-17 ENCOUNTER — Other Ambulatory Visit (HOSPITAL_COMMUNITY): Payer: Self-pay

## 2022-06-09 ENCOUNTER — Ambulatory Visit: Payer: Self-pay | Admitting: Pharmacist

## 2022-06-09 ENCOUNTER — Other Ambulatory Visit: Payer: Self-pay

## 2022-06-19 ENCOUNTER — Encounter: Payer: Self-pay | Admitting: Pharmacist

## 2022-06-19 ENCOUNTER — Ambulatory Visit: Payer: Self-pay | Attending: Family Medicine | Admitting: Pharmacist

## 2022-06-19 VITALS — BP 119/76 | HR 99

## 2022-06-19 DIAGNOSIS — R030 Elevated blood-pressure reading, without diagnosis of hypertension: Secondary | ICD-10-CM

## 2022-06-19 NOTE — Progress Notes (Signed)
   S:    PCP: Dr. Laural Benes   27 y.o. male who presents for hypertension evaluation, education, and management.  PMH is significant for T2DM. Patient was referred and last seen by Primary Care Provider, Dr. Laural Benes, on 05/08/2022.   At last visit, BP was elevated 158/95 mmHg HR 126, upon recheck 150/90 mmHg. Patient reports elevated readings due to being scammed secondary to his social media accounts being hacked and the perpetrators were trying to extort money from him.   Today, patient arrives in good spirits and presents with the assistance of an online interpretor. Denies dizziness, headache, blurred vision, swelling. He reports his issues with money extortion has since resolved.   Patient does not have a diagnosis of hypertension.   Family/Social history:  MI/stroke: father had both, died as a result CKD: none Alcohol/tobacco: none  Not currently on antihypertensives therapy  Reported home BP readings: does not check at home  Patient-reported exercise habits: not often, but when he has the time  O:  Vitals:   06/19/22 1450  BP: 119/76  Pulse: 99     Last 3 Office BP readings: BP Readings from Last 3 Encounters:  06/19/22 119/76  05/08/22 (!) 150/90  04/06/22 119/79    BMET    Component Value Date/Time   NA 139 09/06/2021 0427   K 3.7 09/06/2021 0427   CL 101 09/06/2021 0427   CO2 30 09/06/2021 0427   GLUCOSE 172 (H) 09/06/2021 0427   BUN 9 09/06/2021 0427   CREATININE 0.62 09/06/2021 0427   CALCIUM 9.0 09/06/2021 0427   GFRNONAA >60 09/06/2021 0427    Renal function: CrCl cannot be calculated (Patient's most recent lab result is older than the maximum 21 days allowed.).  Clinical ASCVD: No  The ASCVD Risk score (Arnett DK, et al., 2019) failed to calculate for the following reasons:   The 2019 ASCVD risk score is only valid for ages 41 to 76   A/P: Elevated blood-pressure reading without diagnosis of hypertension. BP normal in clinic today, 119/76 mmHg.  Patient reports his issue with his social media being hacked has since resolved.  -No changes indicated at this time.  -Counseled on lifestyle modifications for blood pressure control including reduced dietary sodium, increased exercise, adequate sleep.  Results reviewed and written information provided.    Written patient instructions provided. Patient verbalized understanding of treatment plan.  Total time in face to face counseling 20 minutes.    Follow-up:  Pharmacist PRN. PCP clinic visit in 07/04/2022.   Valeda Malm, Pharm.D. PGY-2 Ambulatory Care Pharmacy Resident

## 2022-07-04 ENCOUNTER — Other Ambulatory Visit: Payer: Self-pay

## 2022-07-04 ENCOUNTER — Encounter: Payer: Self-pay | Admitting: Internal Medicine

## 2022-07-04 ENCOUNTER — Ambulatory Visit: Payer: Self-pay | Attending: Internal Medicine | Admitting: Internal Medicine

## 2022-07-04 VITALS — BP 115/74 | HR 92 | Temp 98.3°F | Ht 66.0 in | Wt 176.0 lb

## 2022-07-04 DIAGNOSIS — E1129 Type 2 diabetes mellitus with other diabetic kidney complication: Secondary | ICD-10-CM

## 2022-07-04 DIAGNOSIS — Z794 Long term (current) use of insulin: Secondary | ICD-10-CM

## 2022-07-04 DIAGNOSIS — R809 Proteinuria, unspecified: Secondary | ICD-10-CM

## 2022-07-04 DIAGNOSIS — E113211 Type 2 diabetes mellitus with mild nonproliferative diabetic retinopathy with macular edema, right eye: Secondary | ICD-10-CM

## 2022-07-04 LAB — POCT GLYCOSYLATED HEMOGLOBIN (HGB A1C): HbA1c, POC (controlled diabetic range): 10.5 % — AB (ref 0.0–7.0)

## 2022-07-04 LAB — GLUCOSE, POCT (MANUAL RESULT ENTRY): POC Glucose: 273 mg/dl — AB (ref 70–99)

## 2022-07-04 MED ORDER — TRULICITY 1.5 MG/0.5ML ~~LOC~~ SOAJ
1.5000 mg | SUBCUTANEOUS | 6 refills | Status: DC
  Filled 2022-07-04: qty 2, 28d supply, fill #0
  Filled 2022-08-09: qty 2, 28d supply, fill #1
  Filled 2022-09-06: qty 2, 28d supply, fill #2
  Filled 2022-09-26: qty 2, 28d supply, fill #3

## 2022-07-04 NOTE — Patient Instructions (Signed)
Diabetes mellitus y nutricin, en adultos Diabetes Mellitus and Nutrition, Adult Si sufre de diabetes, o diabetes mellitus, es muy importante tener hbitos alimenticios saludables debido a que sus niveles de azcar en la sangre (glucosa) se ven afectados en gran medida por lo que come y bebe. Comer alimentos saludables en las cantidades correctas, aproximadamente a la misma hora todos los das, lo ayudar a: Controlar su glucemia. Disminuir el riesgo de sufrir una enfermedad cardaca. Mejorar la presin arterial. Alcanzar o mantener un peso saludable. Qu puede afectar mi plan de alimentacin? Todas las personas que sufren de diabetes son diferentes y cada una tiene necesidades diferentes en cuanto a un plan de alimentacin. El mdico puede recomendarle que trabaje con un nutricionista para elaborar el mejor plan para usted. Su plan de alimentacin puede variar segn factores como: Las caloras que necesita. Los medicamentos que toma. Su peso. Sus niveles de glucemia, presin arterial y colesterol. Su nivel de actividad. Otras afecciones que tenga, como enfermedades cardacas o renales. Cmo me afectan los carbohidratos? Los carbohidratos, o hidratos de carbono, afectan su nivel de glucemia ms que cualquier otro tipo de alimento. La ingesta de carbohidratos aumenta la cantidad de glucosa en la sangre. Es importante conocer la cantidad de carbohidratos que se pueden ingerir en cada comida sin correr ningn riesgo. Esto es diferente en cada persona. Su nutricionista puede ayudarlo a calcular la cantidad de carbohidratos que debe ingerir en cada comida y en cada refrigerio. Cmo me afecta el alcohol? El alcohol puede provocar una disminucin de la glucemia (hipoglucemia), especialmente si usa insulina o toma determinados medicamentos por va oral para la diabetes. La hipoglucemia es una afeccin potencialmente mortal. Los sntomas de la hipoglucemia, como somnolencia, mareos y confusin, son  similares a los sntomas de haber consumido demasiado alcohol. No beba alcohol si: Su mdico le indica no hacerlo. Est embarazada, puede estar embarazada o est tratando de quedar embarazada. Si bebe alcohol: Limite la cantidad que bebe a lo siguiente: De 0 a 1 medida por da para las mujeres. De 0 a 2 medidas por da para los hombres. Sepa cunta cantidad de alcohol hay en las bebidas que toma. En los [place], una medida equivale a una botella de cerveza de 12 oz (355 ml), un vaso de vino de 5 oz (148 ml) o un vaso de una bebida alcohlica de alta graduacin de 1 oz (44 ml). Mantngase hidratado bebiendo agua, refrescos dietticos o t helado sin azcar. Tenga en cuenta que los refrescos comunes, los jugos y otras bebidas para mezclar pueden contener mucha azcar y se deben contar como carbohidratos. Consejos para seguir este plan  Leer las etiquetas de los alimentos Comience por leer el tamao de la porcin en la etiqueta de Informacin nutricional de los alimentos envasados y las bebidas. La cantidad de caloras, carbohidratos, grasas y otros nutrientes detallados en la etiqueta se basan en una porcin del alimento. Muchos alimentos contienen ms de una porcin por envase. Verifique la cantidad total de gramos (g) de carbohidratos totales en una porcin. Verifique la cantidad de gramos de grasas saturadas y grasas trans en una porcin. Escoja alimentos que no contengan estas grasas o que su contenido de estas sea bajo. Verifique la cantidad de miligramos (mg) de sal (sodio) en una porcin. La mayora de las personas deben limitar la ingesta de sodio total a menos de 2300 mg por da. Siempre consulte la informacin nutricional de los alimentos etiquetados como "con bajo contenido de grasa" o "sin grasa".   Estos alimentos pueden tener un mayor contenido de azcar agregada o carbohidratos refinados, y deben evitarse. Hable con su nutricionista para identificar sus objetivos diarios en  cuanto a los nutrientes mencionados en la etiqueta. Al ir de compras Evite comprar alimentos procesados, enlatados o precocidos. Estos alimentos tienden a tener una mayor cantidad de grasa, sodio y azcar agregada. Compre en la zona exterior de la tienda de comestibles. Esta es la zona donde se encuentran con mayor frecuencia las frutas y las verduras frescas, los cereales a granel, las carnes frescas y los productos lcteos frescos. Al cocinar Use mtodos de coccin a baja temperatura, como hornear, en lugar de mtodos de coccin a alta temperatura, como frer en abundante aceite. Cocine con aceites saludables, como el aceite de oliva, canola o girasol. Evite cocinar con manteca, crema o carnes con alto contenido de grasa. Planificacin de las comidas Coma las comidas y los refrigerios regularmente, preferentemente a la misma hora todos los das. Evite pasar largos perodos de tiempo sin comer. Consuma alimentos ricos en fibra, como frutas frescas, verduras, frijoles y cereales integrales. Consuma entre 4 y 6 onzas (entre 112 y 168 g) de protenas magras por da, como carnes magras, pollo, pescado, huevos o tofu. Una onza (oz) (28 g) de protena magra equivale a: 1 onza (28 g) de carne, pollo o pescado. 1 huevo.  taza (62 g) de tofu. Coma algunos alimentos por da que contengan grasas saludables, como aguacates, frutos secos, semillas y pescado. Qu alimentos debo comer? Frutas Bayas. Manzanas. Naranjas. Duraznos. Damascos. Ciruelas. Uvas. Mangos. Papayas. Granadas. Kiwi. Cerezas. Verduras Verduras de hoja verde, que incluyen lechuga, espinaca, col rizada, acelga, hojas de berza, hojas de mostaza y repollo. Remolachas. Coliflor. Brcoli. Zanahorias. Judas verdes. Tomates. Pimientos. Cebollas. Pepinos. Coles de Bruselas. Granos Granos integrales, como panes, galletas, tortillas, cereales y pastas de salvado o integrales. Avena sin azcar. Quinua. Arroz integral o salvaje. Carnes y otras  protenas Frutos de mar. Carne de ave sin piel. Cortes magros de ave y carne de res. Tofu. Frutos secos. Semillas. Lcteos Productos lcteos sin grasa o con bajo contenido de grasa, como leche, yogur y queso. Es posible que los productos detallados arriba no constituyan una lista completa de los alimentos y las bebidas que puede tomar. Consulte a un nutricionista para obtener ms informacin. Qu alimentos debo evitar? Frutas Frutas enlatadas al almbar. Verduras Verduras enlatadas. Verduras congeladas con mantequilla o salsa de crema. Granos Productos elaborados con harina y harina blanca refinada, como panes, pastas, bocadillos y cereales. Evite todos los alimentos procesados. Carnes y otras protenas Cortes de carne con alto contenido de grasa. Carne de ave con piel. Carnes empanizadas o fritas. Carne procesada. Evite las grasas saturadas. Lcteos Yogur, queso o leche enteros. Bebidas Bebidas azucaradas, como gaseosas o t helado. Es posible que los productos que se enumeran ms arriba no constituyan una lista completa de los alimentos y las bebidas que debe evitar. Consulte a un nutricionista para obtener ms informacin. Preguntas para hacerle al mdico Debo consultar con un especialista certificado en atencin y educacin sobre la diabetes? Es necesario que me rena con un nutricionista? A qu nmero puedo llamar si tengo preguntas? Cules son los mejores momentos para controlar la glucemia? Dnde encontrar ms informacin: American Diabetes Association (Asociacin Estadounidense de la Diabetes): diabetes.org Academy of Nutrition and Dietetics (Academia de Nutricin y Diettica): eatright.org National Institute of Diabetes and Digestive and Kidney Diseases (Instituto Nacional de la Diabetes y las Enfermedades Digestivas y Renales): niddk.nih.gov Association of Diabetes   Care & Education Specialists (Asociacin de Especialistas en Atencin y Educacin sobre la Diabetes):  diabeteseducator.org Resumen Es importante tener hbitos alimenticios saludables debido a que sus niveles de azcar en la sangre (glucosa) se ven afectados en gran medida por lo que come y bebe. Es importante consumir alcohol con prudencia. Un plan de comidas saludable lo ayudar a controlar la glucosa en sangre y a reducir el riesgo de enfermedades cardacas. El mdico puede recomendarle que trabaje con un nutricionista para elaborar el mejor plan para usted. Esta informacin no tiene como fin reemplazar el consejo del mdico. Asegrese de hacerle al mdico cualquier pregunta que tenga. Document Revised: 11/05/2019 Document Reviewed: 11/05/2019 Elsevier Patient Education  2023 Elsevier Inc.  

## 2022-07-04 NOTE — Progress Notes (Signed)
Patient ID: Adam Skinner, male    DOB: May 20, 1995  MRN: [id number]  CC: Diabetes (DM f/u. Med refill. )   Subjective: Adam Skinner is a 27 y.o. 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 #[place], [id number]  DM  Results for orders placed or performed in visit on 07/04/22  POCT glucose (manual entry)  Result Value Ref Range   POC Glucose 273 (A) 70 - 99 mg/dl  POCT glycosylated hemoglobin (Hb A1C)  Result Value Ref Range   Hemoglobin A1C     HbA1c POC (<> result, manual entry)     HbA1c, POC (prediabetic range)     HbA1c, POC (controlled diabetic range) 10.5 (A) 0.0 - 7.0 %  A1C has increased from 6.6 four mths ago to 10.5 today On Basaglar 60 units daily, Humalog 10 units with meals and Trulicity 1.5 mg once a wk.  Skips Humalog if he does not eat.  Skips Basaglar about 3 days a wk if he comes come from work after 11 p.m -Not checking BS in last few wks but has all of his needed supplies. -eating habits "have been pretty bad."  -no insurance as yet to see ophthalmologist.    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 28 capsule 0   Continuous Blood  Gluc Receiver (FREESTYLE LIBRE 2 READER) DEVI Use to check blood sugar at least three times daily. E11.29 1 each 0   Continuous Blood Gluc Sensor (FREESTYLE LIBRE 2 SENSOR) MISC Use to check blood sugar at least three times daily. Change sensors once every 14 days. E11.29 2 each 2   Dulaglutide (TRULICITY) 1.5 MG/0.5ML SOPN Inject 1.5 mg into the skin once a week. 2 mL 3   glucose blood (TRUE METRIX BLOOD GLUCOSE TEST) test strip use up to 4 times a day as directed 200 each 0   Insulin Glargine (BASAGLAR KWIKPEN) 100 UNIT/ML Inject 60 Units into the skin daily. 18 mL 2   insulin lispro (HUMALOG) 100 UNIT/ML KwikPen Inject 10 Units into the skin 3 (three) times daily. 15 mL 3   Insulin Pen Needle 32G X 6 MM MISC use 4 (four) times daily -  before meals and at bedtime. 200 each 1   TRUEplus Lancets 28G MISC use up to 4 times a day as directed 200 each 0   No current facility-administered medications on file prior to visit.    Allergies  Allergen Reactions   Pork-Derived Products     Social History   Socioeconomic History   Marital status: Married    Spouse name: Not on file   Number of children: Not on file   Years  of education: Not on file   Highest education level: Not on file  Occupational History   Not on file  Tobacco Use   Smoking status: Never   Smokeless tobacco: Never  Vaping Use   Vaping Use: Never used  Substance and Sexual Activity   Alcohol use: Not Currently   Drug use: Never   Sexual activity: Not on file  Other Topics Concern   Not on file  Social History Narrative   Not on file   Social Determinants of Health   Financial Resource Strain: Not on file  Food Insecurity: Not on file  Transportation Needs: Not on file  Physical Activity: Not on file  Stress: Not on file  Social Connections: Not on file  Intimate Partner Violence: Not on file    Family History  Problem Relation Age of Onset   Diabetes Mother    Diabetes Father    Heart disease Neg Hx      Past Surgical History:  Procedure Laterality Date   IR RADIOLOGIST EVAL & MGMT  09/15/2021   IR RADIOLOGIST EVAL & MGMT  09/27/2021   LIVER SURGERY      ROS: Review of Systems Negative except as stated above  PHYSICAL EXAM: BP 115/74 (BP Location: Left Arm, Patient Position: Sitting, Cuff Size: Normal)   Pulse 92   Temp 98.3 F (36.8 C) (Oral)   Ht  (1.676 m)   Wt 176 lb (79.8 kg)   SpO2 100%   BMI 28.41 kg/m   Physical Exam General appearance - alert, well appearing, and in no distress Mental status - normal mood, behavior, speech, dress, motor activity, and thought processes Neck - supple, no significant adenopathy Chest - clear to auscultation, no wheezes, rales or rhonchi, symmetric air entry Heart - normal rate, regular rhythm, normal S1, S2, no murmurs, rubs, clicks or gallops Extremities - peripheral pulses normal, no pedal edema, no clubbing or cyanosis     Latest Ref Rng & Units 09/06/2021    4:27 AM 09/04/2021    4:48 AM 09/03/2021    5:11 AM  CMP  Glucose 70 - 99 mg/dL 161  096  045   BUN 6 - 20 mg/dL Creatinine 0.61 - 1.24 mg/dL 4.09  8.11  9.14   Sodium 135 - 145 mmol/L 139  137  135   Potassium 3.5 - 5.1 mmol/L 3.7  3.8  4.2   Chloride 98 - 111 mmol/L 101  98  96   CO2 22 - 32 mmol/L Calcium 8.9 - 10.3 mg/dL 9.0  9.0  9.1    Lipid Panel  No results found for: "CHOL", "TRIG", "HDL", "CHOLHDL", "VLDL", "LDLCALC", "LDLDIRECT"  CBC    Component Value Date/Time   WBC 10.8 10/03/2021 1040   WBC 7.6 09/06/2021 0427   RBC 4.61 10/03/2021 1040   RBC 4.02 (L) 09/06/2021 0427   HGB 12.4 (L) 10/03/2021 1040   HCT 37.7 10/03/2021 1040   PLT 453 (H) 10/03/2021 1040   MCV 82 10/03/2021 1040   MCH 26.9 10/03/2021 1040   MCH 26.4 09/06/2021 0427   MCHC 32.9 10/03/2021 1040   MCHC 31.4 09/06/2021 0427   RDW 13.0 10/03/2021 1040   LYMPHSABS 2.5 10/03/2021 1040   MONOABS 1.2 (H) 09/01/2021 0432   EOSABS 0.0 10/03/2021 1040    BASOSABS 0.0 10/03/2021 1040    ASSESSMENT AND PLAN: 1. Type 2 diabetes mellitus with  microalbuminuria, with long-term current use of insulin Uncontrolled.  Patient not taking his insulin consistently.  Advised to take the Basaglar even if he gets home at 11 PM at night but the goal is to try to take it around about the same time every day.  No change in dose made of Basaglar or Humalog insulin.  Continue Trulicity 1.5 mg once a week. Advised to check blood sugars at least twice a day before meals.  Follow-up with clinical pharmacist in 3 weeks with his log. Healthy eating habits discussed and encouraged. - POCT glucose (manual entry) - POCT glycosylated hemoglobin (Hb A1C)  2. 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    Patient was given the opportunity to ask questions.  Patient verbalized understanding of the plan and was able to repeat key elements of the plan.   This documentation was completed using Paediatric nurse.  Any transcriptional errors are unintentional.  Orders Placed This Encounter  Procedures   POCT glucose (manual entry)   POCT glycosylated hemoglobin (Hb A1C)     Requested Prescriptions   Pending Prescriptions Disp Refills   Dulaglutide (TRULICITY) 1.5 MG/0.5ML SOPN 2 mL 3    Sig: Inject 1.5 mg into the skin once a week.    No follow-ups on file.  Jonah Blue, MD, FACP

## 2022-07-24 ENCOUNTER — Ambulatory Visit: Payer: Self-pay | Admitting: Pharmacist

## 2022-08-03 ENCOUNTER — Ambulatory Visit: Payer: Self-pay | Admitting: Pharmacist

## 2022-08-09 ENCOUNTER — Other Ambulatory Visit: Payer: Self-pay

## 2022-08-11 ENCOUNTER — Ambulatory Visit: Payer: Self-pay | Admitting: Pharmacist

## 2022-08-18 ENCOUNTER — Emergency Department (HOSPITAL_COMMUNITY): Payer: Self-pay

## 2022-08-18 ENCOUNTER — Emergency Department (HOSPITAL_COMMUNITY)
Admission: EM | Admit: 2022-08-18 | Discharge: 2022-08-18 | Disposition: A | Discharge status: Home / Self Care | Payer: Worker's Compensation | Attending: Emergency Medicine | Admitting: Emergency Medicine

## 2022-08-18 ENCOUNTER — Ambulatory Visit: Payer: Self-pay | Attending: Family Medicine | Admitting: Pharmacist

## 2022-08-18 ENCOUNTER — Other Ambulatory Visit: Payer: Self-pay

## 2022-08-18 ENCOUNTER — Other Ambulatory Visit: Payer: Self-pay | Admitting: Pharmacist

## 2022-08-18 DIAGNOSIS — S51811A Laceration without foreign body of right forearm, initial encounter: Secondary | ICD-10-CM | POA: Insufficient documentation

## 2022-08-18 DIAGNOSIS — Z23 Encounter for immunization: Secondary | ICD-10-CM | POA: Diagnosis not present

## 2022-08-18 DIAGNOSIS — W010XXA Fall on same level from slipping, tripping and stumbling without subsequent striking against object, initial encounter: Secondary | ICD-10-CM | POA: Diagnosis not present

## 2022-08-18 DIAGNOSIS — E119 Type 2 diabetes mellitus without complications: Secondary | ICD-10-CM | POA: Diagnosis not present

## 2022-08-18 DIAGNOSIS — S59911A Unspecified injury of right forearm, initial encounter: Secondary | ICD-10-CM

## 2022-08-18 DIAGNOSIS — Z794 Long term (current) use of insulin: Secondary | ICD-10-CM | POA: Diagnosis not present

## 2022-08-18 DIAGNOSIS — S4990XA Unspecified injury of shoulder and upper arm, unspecified arm, initial encounter: Secondary | ICD-10-CM | POA: Diagnosis present

## 2022-08-18 DIAGNOSIS — E1129 Type 2 diabetes mellitus with other diabetic kidney complication: Secondary | ICD-10-CM

## 2022-08-18 DIAGNOSIS — R809 Proteinuria, unspecified: Secondary | ICD-10-CM

## 2022-08-18 MED ORDER — TETANUS-DIPHTH-ACELL PERTUSSIS 5-2.5-18.5 LF-MCG/0.5 IM SUSY
0.5000 mL | PREFILLED_SYRINGE | Freq: Once | INTRAMUSCULAR | Status: AC
  Administered 2022-08-18: 0.5 mL via INTRAMUSCULAR
  Filled 2022-08-18: qty 0.5

## 2022-08-18 MED ORDER — INSULIN PEN NEEDLE 32G X 6 MM MISC
1.0000 | Freq: Three times a day (TID) | 1 refills | Status: DC
  Filled 2022-08-18 – 2022-11-27 (×3): qty 100, 25d supply, fill #0

## 2022-08-18 MED ORDER — IBUPROFEN 800 MG PO TABS
800.0000 mg | ORAL_TABLET | Freq: Once | ORAL | Status: AC
  Administered 2022-08-18: 800 mg via ORAL
  Filled 2022-08-18: qty 1

## 2022-08-18 MED ORDER — FREESTYLE LIBRE 2 READER DEVI
0 refills | Status: DC
Start: 2022-08-18 — End: 2023-01-30

## 2022-08-18 MED ORDER — FREESTYLE LIBRE 2 SENSOR MISC
2 refills | Status: DC
Start: 2022-08-18 — End: 2022-10-17

## 2022-08-18 NOTE — Discharge Instructions (Signed)
Evaluation for your right forearm injury was overall reassuring.  X-rays were negative.  Suspect your pain is from soft tissue swelling.  You can treat your pain with ibuprofen Tylenol and ice.  Advised to follow-up your PCP.

## 2022-08-18 NOTE — Progress Notes (Signed)
    S:     27 y.o. male who presents for diabetes evaluation, education, and management. PMH is significant for T2DM. Patient was referred and last seen by Primary Care Provider, Dr. Laural Benes, on 07/04/2022. A1c at that visit was up from 6.6 to 10.5% d/t poor adherence to insulin and dietary indiscretion. At that visit on 07/04/22, adherence was encouraged. We did not make any changes.  Today, patient arrives in good spirits and presents without any assistance. He tells me today that he is doing better with adherence. He is taking the Basaglar every day and Humalog TID. He admits that he was not doing Basaglar most nights prior to his last appt with Dr. Laural Benes. He remains complaint with Trulicity. Denies any NV, abdominal pain.   Family/Social History:  -Fhx: none reported -Tobacco: none reported -Alcohol: none reported  Current diabetes medications include: Trulicity 1.5 mg weekly, glargine 60 units daily, lispro 10 units TID  Patient reports adherence to taking all medications as prescribed.  Insurance coverage: None  Patient denies hypoglycemic events.   Reported blood sugars at home: 90s - 160s since visit with Dr. Laural Benes   Patient denies nocturia (nighttime urination).  Patient denies neuropathy (nerve pain). Patient denies visual changes.  Patient denies self foot exams.   Patient reported dietary habits: Eats 3 meals/day Breakfast: Eats at job, brocooli, zucchini, omelete  Lunch: chicken and vegetables  Dinner: chicken and vegetables Snacks: No  Drinks: Water  - Has attempted to improve his diet since last visit but sometimes struggles  Patient-reported exercise habits:  - Very active with his work but then walks daily when not working  O:   Lab Results  Component Value Date   HGBA1C 10.5 (A) 07/04/2022     A/P: Diabetes longstanding and currently uncontrolled. His home sugars seem to be improving with improved adherence. Patient is able to verbalize appropriate  hypoglycemia management plan. Medication adherence appears to be improving. -Continued insulin glargine 60 units daily.  -Continued insulin lispro 10 units three times daily with meals -Continued Trulicity1.5 mg weekly.  -Patient educated on proper use, and potential adverse effects of Trulicity and insulins.  -Reinforced importance of diet and exercise, patient already has an appropriate diet and get appropriate exercise walking dog and playing volleyball. -Counseled on s/sx of and management of hypoglycemia.  -Next A1c anticipated 09/2022.   Written patient instructions provided. Patient verbalized understanding of treatment plan.  Total time in face to face counseling 60 minutes.    Follow-up:  Pharmacist in one month. PCP clinic visit on 10/09/2022.  Butch Penny, PharmD, [place]y [place], CPP Clinical Pharmacist [facility] [phone]

## 2022-08-18 NOTE — ED Triage Notes (Signed)
PT arrived via POV. Pt was at work and slipped on floor then landed on R forearm. Pt has abrasion. No LOC

## 2022-08-18 NOTE — ED Provider Notes (Signed)
[place] EMERGENCY DEPARTMENT AT [facility] Provider Note   CSN: [id number] Arrival date & time: 08/18/22  1200     History  Chief Complaint  Patient presents with   Fall   Arm Injury   HPI Adam Skinner is a 27 y.o. male with diabetes presenting for arm injury.  Occurred about 2 hours ago.  Patient was at work when he slipped and fell landing on his right forearm.  Denies head injury or any injury to the chest or abdomen.  Denies LOC.  States he now has pain in the right forearm and in his fingers as well.  States range of motion of the right arm is intact but can be painful with rotation of forearm.  States he has a scratch on the under part of his right forearm that he believes was scratched on the piece of metal frame at work as he was falling.  Unsure if he is ever had a tetanus shot.   Fall  Arm Injury      Home Medications Prior to Admission medications   Medication Sig Start Date End Date Taking? Authorizing Provider  blood glucose meter kit and supplies Use up to 4 times a day as directed 09/07/21   Almon Hercules, MD  Continuous Blood Gluc Receiver (FREESTYLE LIBRE 2 READER) DEVI Use to check blood sugar at least three times daily. E11.29 01/19/22   Marcine Matar, MD  Continuous Blood Gluc Sensor (FREESTYLE LIBRE 2 SENSOR) MISC Use to check blood sugar at least three times daily. Change sensors once every 14 days. E11.29 01/19/22   Marcine Matar, MD  Dulaglutide (TRULICITY) 1.5 MG/0.5ML SOPN Inject 1.5 mg into the skin once a week. 07/04/22   Marcine Matar, MD  glucose blood (TRUE METRIX BLOOD GLUCOSE TEST) test strip use up to 4 times a day as directed 09/07/21   Almon Hercules, MD  Insulin Glargine (BASAGLAR KWIKPEN) 100 UNIT/ML Inject 60 Units into the skin daily. 05/08/22   Marcine Matar, MD  insulin lispro (HUMALOG) 100 UNIT/ML KwikPen Inject 10 Units into the skin 3 (three) times daily. 05/08/22   Marcine Matar, MD  Insulin Pen  Needle 32G X 6 MM MISC use 4 (four) times daily -  before meals and at bedtime. 09/07/21   Almon Hercules, MD  TRUEplus Lancets 28G MISC use up to 4 times a day as directed 09/07/21   Almon Hercules, MD      Allergies    Pork-derived products    Review of Systems   See HPI for pertinent positives  Physical Exam Updated Vital Signs BP (!) 132/92 (BP Location: Left Arm)   Pulse (!) 101   Temp 98 F (36.7 C) (Oral)   Resp 18   Ht 5\' 6"  (1.676 m)   Wt 68 kg   SpO2 100%   BMI 24.21 kg/m  Physical Exam Constitutional:      Appearance: Normal appearance.  HENT:     Head: Normocephalic.     Nose: Nose normal.  Eyes:     Conjunctiva/sclera: Conjunctivae normal.  Pulmonary:     Effort: Pulmonary effort is normal.  Chest:     Chest wall: No mass, lacerations, deformity, swelling, tenderness or crepitus.  Musculoskeletal:     Right elbow: Normal. No swelling, deformity or effusion. Normal range of motion.     Left elbow: Normal. No swelling.     Right forearm: Laceration and tenderness present. No  swelling, edema, deformity or bony tenderness.     Right wrist: Normal. No snuff box tenderness or crepitus. Normal range of motion. Normal pulse.     Left wrist: Normal.     Right hand: Normal. No swelling or deformity. Normal range of motion. Normal capillary refill. Normal pulse.     Comments: Small very superficial laceration to the medial aspect of the right forearm  Neurological:     Mental Status: He is alert.  Psychiatric:        Mood and Affect: Mood normal.     ED Results / Procedures / Treatments   Labs (all labs ordered are listed, but only abnormal results are displayed) Labs Reviewed - No data to display  EKG None  Radiology DG Forearm Right  Result Date: 08/18/2022 CLINICAL DATA:  Fall. Patient was at work and slipped on the floor then landed on right forearm. EXAM: RIGHT FOREARM - 2 VIEW; RIGHT WRIST - COMPLETE 3+ VIEW; RIGHT HAND - COMPLETE 3+ VIEW COMPARISON:   None Available. FINDINGS: Right forearm: There is no evidence of fracture or other focal bone lesions. Soft tissues are unremarkable. Right wrist: There is no evidence of fracture or other focal bone lesions. Soft tissues are unremarkable. Right hand: There is no evidence of fracture or other focal bone lesions. Soft tissues are unremarkable. IMPRESSION: Negative radiographs of the right forearm, right wrist and right hand. Electronically Signed   By: Larose Hires D.O.   On: 08/18/2022 13:19   DG Wrist Complete Right  Result Date: 08/18/2022 CLINICAL DATA:  Fall. Patient was at work and slipped on the floor then landed on right forearm. EXAM: RIGHT FOREARM - 2 VIEW; RIGHT WRIST - COMPLETE 3+ VIEW; RIGHT HAND - COMPLETE 3+ VIEW COMPARISON:  None Available. FINDINGS: Right forearm: There is no evidence of fracture or other focal bone lesions. Soft tissues are unremarkable. Right wrist: There is no evidence of fracture or other focal bone lesions. Soft tissues are unremarkable. Right hand: There is no evidence of fracture or other focal bone lesions. Soft tissues are unremarkable. IMPRESSION: Negative radiographs of the right forearm, right wrist and right hand. Electronically Signed   By: Larose Hires D.O.   On: 08/18/2022 13:19   DG Hand Complete Right  Result Date: 08/18/2022 CLINICAL DATA:  Fall. Patient was at work and slipped on the floor then landed on right forearm. EXAM: RIGHT FOREARM - 2 VIEW; RIGHT WRIST - COMPLETE 3+ VIEW; RIGHT HAND - COMPLETE 3+ VIEW COMPARISON:  None Available. FINDINGS: Right forearm: There is no evidence of fracture or other focal bone lesions. Soft tissues are unremarkable. Right wrist: There is no evidence of fracture or other focal bone lesions. Soft tissues are unremarkable. Right hand: There is no evidence of fracture or other focal bone lesions. Soft tissues are unremarkable. IMPRESSION: Negative radiographs of the right forearm, right wrist and right hand. Electronically  Signed   By: Larose Hires D.O.   On: 08/18/2022 13:19    Procedures Procedures    Medications Ordered in ED Medications  Tdap (BOOSTRIX) injection 0.5 mL (0.5 mLs Intramuscular Given 08/18/22 1235)  ibuprofen (ADVIL) tablet 800 mg (800 mg Oral Given 08/18/22 1235)    ED Course/ Medical Decision Making/ A&P                             Medical Decision Making Amount and/or Complexity of Data Reviewed Radiology: ordered.  Risk  Prescription drug management.   27 year old well-appearing male presenting for right forearm injury.  Exam notable for superficial laceration but otherwise unremarkable.  X-rays were negative.  Suspect this pain is from soft tissue injury sustained in the fall.  Treated with ibuprofen.  Administer Tdap booster.  Advised to follow-up with his PCP.        Final Clinical Impression(s) / ED Diagnoses Final diagnoses:  Injury of right forearm, initial encounter    Rx / DC Orders ED Discharge Orders     None         Gareth Eagle, PA-C 08/18/22 1330    Lorre Nick, MD 08/19/22 2247

## 2022-08-24 ENCOUNTER — Other Ambulatory Visit: Payer: Self-pay

## 2022-09-05 ENCOUNTER — Ambulatory Visit: Payer: Self-pay | Admitting: Internal Medicine

## 2022-09-06 ENCOUNTER — Other Ambulatory Visit: Payer: Self-pay

## 2022-09-22 ENCOUNTER — Ambulatory Visit: Payer: Self-pay | Admitting: Pharmacist

## 2022-09-26 ENCOUNTER — Ambulatory Visit: Payer: Self-pay | Attending: Internal Medicine | Admitting: Pharmacist

## 2022-09-26 ENCOUNTER — Other Ambulatory Visit: Payer: Self-pay

## 2022-09-26 ENCOUNTER — Encounter: Payer: Self-pay | Admitting: Pharmacist

## 2022-09-26 DIAGNOSIS — Z7985 Long-term (current) use of injectable non-insulin antidiabetic drugs: Secondary | ICD-10-CM

## 2022-09-26 DIAGNOSIS — R809 Proteinuria, unspecified: Secondary | ICD-10-CM

## 2022-09-26 DIAGNOSIS — Z794 Long term (current) use of insulin: Secondary | ICD-10-CM

## 2022-09-26 DIAGNOSIS — E1129 Type 2 diabetes mellitus with other diabetic kidney complication: Secondary | ICD-10-CM

## 2022-09-26 LAB — POCT GLYCOSYLATED HEMOGLOBIN (HGB A1C): HbA1c, POC (controlled diabetic range): 10 % — AB (ref 0.0–7.0)

## 2022-09-26 MED ORDER — INSULIN LISPRO (1 UNIT DIAL) 100 UNIT/ML (KWIKPEN)
15.0000 [IU] | PEN_INJECTOR | Freq: Three times a day (TID) | SUBCUTANEOUS | 3 refills | Status: DC
Start: 2022-09-26 — End: 2023-01-02
  Filled 2022-09-26: qty 15, 34d supply, fill #0

## 2022-09-26 NOTE — Progress Notes (Signed)
    S:     27 y.o. male who presents for diabetes evaluation, education, and management. PMH is significant for T2DM. Patient was referred and last seen by Primary Care Provider, Dr. Laural Benes, on 07/04/2022. A1c at that visit was up from 6.6 to 10.5% d/t poor adherence to insulin and dietary indiscretion. We saw him subsequently on 08/18/2022 and reported improved adherence and diet modification. His reported home sugar levels were close to goal.   Today, patient arrives in good spirits and presents without any assistance. A1c today is minimally improved at 10 (down from 10.5%). He tells me today that he is doing better with adherence but still went some time over June and May missing his insulin. I reviewed his fill records with our pharmacy and he last filled Basaglar & Humalog on 2/29/204.   Family/Social History:  -Fhx: none reported -Tobacco: none reported -Alcohol: none reported  Current diabetes medications include: Trulicity 1.5 mg weekly, glargine 60 units daily, lispro 10 units TID  Patient reports adherence to taking all medications as prescribed.  Insurance coverage: None  Patient denies hypoglycemic events.   Reported blood sugars at home:admits to 160s-170s fasting. Some post-prandial levels and before-bedtime levels are >250.  Patient denies nocturia (nighttime urination).  Patient denies neuropathy (nerve pain). Patient denies visual changes.  Patient denies self foot exams.   Patient reported dietary habits: Eats 3 meals/day Breakfast: Eats at job, brocooli, zucchini, omelete  Lunch: chicken and vegetables  Dinner: chicken and vegetables Snacks: No  Drinks: Water  - Has attempted to improve his diet since last visit but sometimes struggles  Patient-reported exercise habits:  - Very active with his work but then walks daily when not working  O:   Lab Results  Component Value Date   HGBA1C 10.5 (A) 07/04/2022     A/P: Diabetes longstanding and currently  uncontrolled. He is not hypoglycemic but is able to verbalize appropriate hypoglycemia management plan. Medication adherence appears to be suboptimal. HE is way past due on his refills. Will have him fill his medications today and increase lispro to 15u TID before meals.  -Continued insulin glargine 60 units daily.  -Increase insulin lispro to 15 units three times daily with meals -Continued Trulicity1.5 mg weekly.  -Patient educated on proper use, and potential adverse effects of Trulicity and insulins.  -Reinforced importance of diet and exercise, patient already has an appropriate diet and get appropriate exercise walking dog and playing volleyball. -Counseled on s/sx of and management of hypoglycemia.  -Next A1c anticipated 09/2022.   Written patient instructions provided. Patient verbalized understanding of treatment plan.  Total time in face to face counseling 60 minutes.    Follow-up:  Pharmacist in 2 months. PCP clinic visit on 10/17/2022.  Butch Penny, PharmD, [place], CPP Clinical Pharmacist [facility] & [facility] [phone]

## 2022-10-09 ENCOUNTER — Ambulatory Visit: Payer: Self-pay | Admitting: Internal Medicine

## 2022-10-17 ENCOUNTER — Other Ambulatory Visit: Payer: Self-pay

## 2022-10-17 ENCOUNTER — Ambulatory Visit: Payer: Self-pay | Attending: Internal Medicine | Admitting: Internal Medicine

## 2022-10-17 ENCOUNTER — Encounter: Payer: Self-pay | Admitting: Internal Medicine

## 2022-10-17 DIAGNOSIS — Z794 Long term (current) use of insulin: Secondary | ICD-10-CM

## 2022-10-17 DIAGNOSIS — E1129 Type 2 diabetes mellitus with other diabetic kidney complication: Secondary | ICD-10-CM

## 2022-10-17 DIAGNOSIS — R809 Proteinuria, unspecified: Secondary | ICD-10-CM

## 2022-10-17 MED ORDER — TRULICITY 1.5 MG/0.5ML ~~LOC~~ SOAJ
1.5000 mg | SUBCUTANEOUS | 6 refills | Status: DC
Start: 2022-10-17 — End: 2022-11-28
  Filled 2022-10-17 – 2022-11-27 (×2): qty 2, 28d supply, fill #0

## 2022-10-17 MED ORDER — BASAGLAR KWIKPEN 100 UNIT/ML ~~LOC~~ SOPN
60.0000 [IU] | PEN_INJECTOR | Freq: Every day | SUBCUTANEOUS | 6 refills | Status: DC
Start: 2022-10-17 — End: 2022-11-28
  Filled 2022-10-17: qty 18, 30d supply, fill #0
  Filled 2022-11-27: qty 18, 30d supply, fill #1

## 2022-10-17 MED ORDER — FREESTYLE LIBRE 2 SENSOR MISC
5 refills | Status: DC
Start: 2022-10-17 — End: 2023-01-30
  Filled 2022-10-17: qty 2, 28d supply, fill #0
  Filled 2022-11-28: qty 2, 28d supply, fill #1
  Filled 2023-01-02 – 2023-01-26 (×2): qty 2, 28d supply, fill #2

## 2022-10-17 NOTE — Progress Notes (Signed)
Patient ID: Adam Skinner, male    DOB: [birth year]  MRN: [id number]  CC: Diabetes (DM f/u. Med refills. /Reports low BS readings - 54 most recent reading at 1:24 p.m. today (pt had soda) & 84 reading at 1:45 p.m.)   Subjective: Adam Skinner is a 27 y.o. male who presents for chronic ds management. 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 #[id number]  DM:  Lab Results  Component Value Date   HGBA1C 10.0 (A) 09/26/2022  Since last visit, he has seen a clinical pharmacist twice.  A1C last mth had not improved from when he last saw me 06/2022.  Adherence with medication suboptimal on last visit with clinical pharmacist.  Refill was sent on glargine 60 units daily and Humalog increased to 15 units with meals.  Trulicity 1.5 mg once a wk was continued. Reports taking meds consistently since then.   -has CGM [place].  I was able to look at his app on his phone.  Over the past 7 days, blood sugars have been within target range of 70-180 87% of the times, between 181-240 10% of the times and less than 70 2% of the times.  Over the past 2 weeks: Within target range 89% of the times, b/w 181-240 9% and less than 70 1% -Patient reports he has put himself on a strict diet and he does cardiac workout 4-5 times a week.  Patient Active Problem List   Diagnosis Date Noted   Type 2 diabetes mellitus with microalbuminuria, with long-term current use of insulin (HCC) 01/04/2022   Hyponatremia 08/31/2021   Bandemia 08/31/2021   Normocytic anemia 08/31/2021   Language barrier 08/31/2021   Epigastric pain    Liver abscess 08/28/2021   Uncontrolled diabetes mellitus with hyperglycemia, without long-term current use of insulin (HCC) 08/28/2021     Current Outpatient Medications on File Prior to Visit  Medication Sig Dispense Refill    blood glucose meter kit and supplies Use up to 4 times a day as directed 1 each 0   Continuous Glucose Receiver (FREESTYLE LIBRE 2 READER) DEVI Use to check blood sugar at least three times daily. E11.29 1 each 0   glucose blood (TRUE METRIX BLOOD GLUCOSE TEST) test strip use up to 4 times a day as directed 200 each 0   insulin lispro (HUMALOG) 100 UNIT/ML KwikPen Inject 15 Units into the skin 3 (three) times daily. 15 mL 3   Insulin Pen Needle 32G X 6 MM MISC use 4 (four) times daily -  before meals and at bedtime. 200 each 1   TRUEplus Lancets 28G MISC use up to 4 times a day as directed 200 each 0   No current facility-administered medications on file prior to visit.    Allergies  Allergen Reactions   Pork-Derived Products     Social History   Socioeconomic History   Marital status: Married    Spouse name: Not on file   Number of children: Not on file   Years of education: Not on file   Highest education level: Not on file  Occupational History   Not on file  Tobacco Use   Smoking status: Never   Smokeless tobacco: Never  Vaping Use   Vaping status: Never Used  Substance and Sexual Activity   Alcohol use: Not Currently   Drug  use: Never   Sexual activity: Not on file  Other Topics Concern   Not on file  Social History Narrative   Not on file   Social Determinants of Health   Financial Resource Strain: Not on file  Food Insecurity: Not on file  Transportation Needs: Not on file  Physical Activity: Not on file  Stress: Not on file  Social Connections: Not on file  Intimate Partner Violence: Not on file    Family History  Problem Relation Age of Onset   Diabetes Mother    Diabetes Father    Heart disease Neg Hx     Past Surgical History:  Procedure Laterality Date   IR RADIOLOGIST EVAL & MGMT  09/15/2021   IR RADIOLOGIST EVAL & MGMT  09/27/2021   LIVER SURGERY      ROS: Review of Systems Negative except as stated above  PHYSICAL EXAM: BP 122/77  (BP Location: Left Arm, Patient Position: Sitting, Cuff Size: Normal)   Pulse 100   Temp 98.1 F (36.7 C) (Oral)   Ht 5\' 6"  (1.676 m)   Wt 183 lb 3.2 oz (83.1 kg)   SpO2 99%   BMI 29.57 kg/m   Physical Exam   General appearance - alert, well appearing, and in no distress Mental status - normal mood, behavior, speech, dress, motor activity, and thought processes Neck - supple, no significant adenopathy Chest - clear to auscultation, no wheezes, rales or rhonchi, symmetric air entry Heart - normal rate, regular rhythm, normal S1, S2, no murmurs, rubs, clicks or gallops Extremities - peripheral pulses normal, no pedal edema, no clubbing or cyanosis     Latest Ref Rng & Units 09/06/2021    4:27 AM 09/04/2021    4:48 AM 09/03/2021    5:11 AM  CMP  Glucose 70 - 99 mg/dL 782  956  213   BUN 6 - 20 mg/dL 9  10  12    Creatinine 0.61 - 1.24 mg/dL 0.86  5.78  4.69   Sodium 135 - 145 mmol/L 139  137  135   Potassium 3.5 - 5.1 mmol/L 3.7  3.8  4.2   Chloride 98 - 111 mmol/L 101  98  96   CO2 22 - 32 mmol/L 30  31  30    Calcium 8.9 - 10.3 mg/dL 9.0  9.0  9.1    Lipid Panel  No results found for: "CHOL", "TRIG", "HDL", "CHOLHDL", "VLDL", "LDLCALC", "LDLDIRECT"  CBC    Component Value Date/Time   WBC 10.8 10/03/2021 1040   WBC 7.6 09/06/2021 0427   RBC 4.61 10/03/2021 1040   RBC 4.02 (L) 09/06/2021 0427   HGB 12.4 (L) 10/03/2021 1040   HCT 37.7 10/03/2021 1040   PLT 453 (H) 10/03/2021 1040   MCV 82 10/03/2021 1040   MCH 26.9 10/03/2021 1040   MCH 26.4 09/06/2021 0427   MCHC 32.9 10/03/2021 1040   MCHC 31.4 09/06/2021 0427   RDW 13.0 10/03/2021 1040   LYMPHSABS 2.5 10/03/2021 1040   MONOABS 1.2 (H) 09/01/2021 0432   EOSABS 0.0 10/03/2021 1040   BASOSABS 0.0 10/03/2021 1040    ASSESSMENT AND PLAN:  1. Type 2 diabetes mellitus with microalbuminuria, with long-term current use of insulin (HCC) -Blood sugars are improving.  Encouraged him to continue healthy eating habits and  regular exercise. Continue glargine insulin 60 units once a day, Humalog 15 units with meals and Trulicity 1.5 mg once a week.    Patient was given the opportunity to  ask questions.  Patient verbalized understanding of the plan and was able to repeat key elements of the plan.   This documentation was completed using Paediatric nurse.  Any transcriptional errors are unintentional.  Orders Placed This Encounter  Procedures   CBC   Comprehensive metabolic panel     Requested Prescriptions   Signed Prescriptions Disp Refills   Insulin Glargine (BASAGLAR KWIKPEN) 100 UNIT/ML 18 mL 6    Sig: Inject 60 Units into the skin daily.   Dulaglutide (TRULICITY) 1.5 MG/0.5ML SOPN 2 mL 6    Sig: Inject 1.5 mg into the skin once a week.   Continuous Glucose Sensor (FREESTYLE LIBRE 2 SENSOR) MISC 2 each 5    Sig: Use to check blood sugar at least three times daily. Change sensors once every 14 days. E11.29    Return in about 4 months (around 02/16/2023) for Give lab appt for sometime this week.  Jonah Blue, MD, FACP

## 2022-10-18 ENCOUNTER — Other Ambulatory Visit: Payer: Self-pay

## 2022-10-19 ENCOUNTER — Ambulatory Visit: Payer: Self-pay | Attending: Internal Medicine

## 2022-10-19 DIAGNOSIS — E1129 Type 2 diabetes mellitus with other diabetic kidney complication: Secondary | ICD-10-CM

## 2022-10-24 ENCOUNTER — Other Ambulatory Visit: Payer: Self-pay

## 2022-11-27 ENCOUNTER — Other Ambulatory Visit: Payer: Self-pay

## 2022-11-27 NOTE — Progress Notes (Unsigned)
S:     No chief complaint on file.  27 y.o. male who presents for diabetes evaluation, education, and management. Patient was referred and last seen by Primary Care Provider, Dr. Laural Benes on 10/17/22. Patient was last seen by Pharmacy clinic on 09/26/22. PMH is significant for T2DM.  At last visit with pharmacy on 7/16, patient reported non-adherence to insulin which was reflected in his A1c of 10.0%. Humalog was increased from 10 to 15 units TID AC. On 10/17/22 w/ Dr. Laural Benes, his CGM report indicated improved BG control and he reported adhering to a strict diet and does cardiac exercise 4-5 times/week  Today, Patient arrives in good spirits and presents without any assistance. Patient is accompanied by [place] interpreter [place]. Patient reported a faulty sensor indicating BG readings >300, however, upon fingerstick check readings reflected 165. The sensor was removed after 4 days and he was been checking BG via fingerstick since--no recent CGM data to view today.   Family/Social History:  -Fhx: none reported -Tobacco: none reported -Alcohol: none reported  Current diabetes medications include: Trulicity 1.5 mg weekly, glargine 60 units daily, lispro 15 units TID   Patient reports adherence to taking all medications as prescribed. Patient denies adherence with medications, reports taking insulin lispro ~2 times daily w/ meals.   Insurance coverage: none  Patient reports hypoglycemic events. A few readings around 80 w/ feelings of shakiness, confusion, and sweating.  Reported home fasting blood sugars: 150  Reported 2 hour post-meal/random blood sugars: 210.  Patient denies nocturia (nighttime urination).  Patient denies neuropathy (nerve pain). Patient denies visual changes. Patient denies self foot exams.   Patient reported dietary habits: not discussed this visit  Patient-reported exercise habits: not discussed this visit   O:   ROS  Physical Exam  Lab Results  Component  Value Date   HGBA1C 10.0 (A) 09/26/2022   There were no vitals filed for this visit.  Lipid Panel  No results found for: "CHOL", "TRIG", "HDL", "CHOLHDL", "VLDL", "LDLCALC", "LDLDIRECT"  Clinical Atherosclerotic Cardiovascular Disease (ASCVD): No  The ASCVD Risk score (Arnett DK, et al., 2019) failed to calculate for the following reasons:   The 2019 ASCVD risk score is only valid for ages 19 to 49   Patient is participating in a Managed Medicaid Plan: No   A/P: Diabetes longstanding currently uncontrolled due to suboptimal insulin regimen (overbasalization with glargine). Patient is able to verbalize appropriate hypoglycemia management plan. Medication adherence appears improved. Faulty CGM sensor leaves [place] with limited BG readings to interpret--placement of his next sensor is to be performed at the community pharmacy downstairs today before he goes home. Venous A1c performed today due to lack of BG readings--patient to be notified of results tomorrow along with any necessary adjustments to insulin lispro regimen.  -Decreased dose of basal insulin Basaglar (insulin glargine) from 60 units to 50 units daily in the morning.  -Continued rapid insulin Humalog (insulin lispro) 15 units three times daily before meals. Hold if pre-meal BG <150 or if skipping the meal.  -Increased dose of GLP-1 Trulicity (dulaglutide) from 1.5 mg to 3 mg weekly.  -Patient educated on purpose, proper use, and potential adverse effects of Trulicity & insulin.  -Extensively discussed pathophysiology of diabetes, recommended lifestyle interventions, dietary effects on blood sugar control.  -Counseled on s/sx of and management of hypoglycemia.  -Next A1c anticipated 02/2023.   Written patient instructions provided. Patient verbalized understanding of treatment plan.  Total time in face to face counseling 30  minutes.    Follow-up:  Pharmacist:  In 1 month  Patient seen with Rosina Lowenstein, PharmD Candidate [employer]  [facility]  Adam Skinner, PharmD, [place], CPP Clinical Pharmacist [facility] & [facility] [phone]

## 2022-11-28 ENCOUNTER — Other Ambulatory Visit: Payer: Self-pay

## 2022-11-28 ENCOUNTER — Encounter: Payer: Self-pay | Admitting: Pharmacist

## 2022-11-28 ENCOUNTER — Ambulatory Visit: Payer: Self-pay | Attending: Internal Medicine | Admitting: Pharmacist

## 2022-11-28 DIAGNOSIS — E1129 Type 2 diabetes mellitus with other diabetic kidney complication: Secondary | ICD-10-CM

## 2022-11-28 DIAGNOSIS — Z7985 Long-term (current) use of injectable non-insulin antidiabetic drugs: Secondary | ICD-10-CM

## 2022-11-28 DIAGNOSIS — Z794 Long term (current) use of insulin: Secondary | ICD-10-CM

## 2022-11-28 DIAGNOSIS — R809 Proteinuria, unspecified: Secondary | ICD-10-CM

## 2022-11-28 MED ORDER — TRULICITY 3 MG/0.5ML ~~LOC~~ SOAJ
3.0000 mg | SUBCUTANEOUS | 1 refills | Status: DC
Start: 2022-11-28 — End: 2023-01-02
  Filled 2022-11-28: qty 2, 28d supply, fill #0

## 2022-11-28 MED ORDER — BASAGLAR KWIKPEN 100 UNIT/ML ~~LOC~~ SOPN
50.0000 [IU] | PEN_INJECTOR | Freq: Every day | SUBCUTANEOUS | 6 refills | Status: DC
Start: 2022-11-28 — End: 2023-01-02
  Filled 2022-11-28: qty 18, 36d supply, fill #0

## 2023-01-02 ENCOUNTER — Encounter: Payer: Self-pay | Admitting: Pharmacist

## 2023-01-02 ENCOUNTER — Ambulatory Visit: Payer: Self-pay | Attending: Internal Medicine | Admitting: Pharmacist

## 2023-01-02 ENCOUNTER — Other Ambulatory Visit: Payer: Self-pay

## 2023-01-02 DIAGNOSIS — Z7985 Long-term (current) use of injectable non-insulin antidiabetic drugs: Secondary | ICD-10-CM

## 2023-01-02 DIAGNOSIS — E1129 Type 2 diabetes mellitus with other diabetic kidney complication: Secondary | ICD-10-CM

## 2023-01-02 DIAGNOSIS — R809 Proteinuria, unspecified: Secondary | ICD-10-CM

## 2023-01-02 DIAGNOSIS — Z794 Long term (current) use of insulin: Secondary | ICD-10-CM

## 2023-01-02 MED ORDER — INSULIN PEN NEEDLE 32G X 6 MM MISC
1.0000 | Freq: Three times a day (TID) | 1 refills | Status: DC
  Filled 2023-01-02 – 2023-01-26 (×2): qty 100, 25d supply, fill #0
  Filled 2023-03-08: qty 100, 25d supply, fill #1

## 2023-01-02 MED ORDER — TRULICITY 4.5 MG/0.5ML ~~LOC~~ SOAJ
4.5000 mg | SUBCUTANEOUS | 1 refills | Status: DC
  Filled 2023-01-02: qty 2, 28d supply, fill #0

## 2023-01-02 MED ORDER — INSULIN LISPRO (1 UNIT DIAL) 100 UNIT/ML (KWIKPEN)
20.0000 [IU] | PEN_INJECTOR | Freq: Three times a day (TID) | SUBCUTANEOUS | 3 refills | Status: DC
Start: 2023-01-02 — End: 2023-04-24
  Filled 2023-01-02 – 2023-03-08 (×2): qty 15, 25d supply, fill #0

## 2023-01-02 MED ORDER — BASAGLAR KWIKPEN 100 UNIT/ML ~~LOC~~ SOPN
60.0000 [IU] | PEN_INJECTOR | Freq: Every day | SUBCUTANEOUS | 6 refills | Status: DC
Start: 2023-01-02 — End: 2023-02-20
  Filled 2023-01-02 – 2023-01-26 (×2): qty 18, 30d supply, fill #0

## 2023-01-02 NOTE — Progress Notes (Signed)
S:     No chief complaint on file.  27 y.o. male who presents for diabetes evaluation, education, and management. Patient was referred and last seen by Primary Care Provider, Dr. Laural Benes on 10/17/22. Patient was last seen by Pharmacy clinic on 11/28/2022. A1c at that visit was 9.4% (down from 10% prior).   At his visit with pharmacy last month, patient endorsed some issues with his CGM sensors. He also endorsed some relativ hypoglycemia with symptoms occurring when his home sugar was in the 80s, but for the most part his home glucose readings ranged from 150 - 210. We decreased his insulin dose d/t hypoglycemia symptoms and increased his Trulicity dose instead. We continued him on the same dose of Humalog but emphasized that he take it before meals.   Today, Patient arrives in good spirits and presents without any assistance. Patient is accompanied by spanish interpreter [place], [place] # [phone].Marland Kitchen Since last visit, he is tolerating the Trulicity well. Denies any NV, abdominal pain, or changes in vision. Regarding his sensors, his Josephine Igo sensors are working better. Unfortunately, per pharmacy, his evoucher is maxed out on the Trulicity. Additionally, Ozempic is non-formulary. His copay for GLP-1 RA therapy is cost-prohibitive at this time. Additionally, he continues to take Basaglar 60u daily instead of reducing to 50u as rx'd. He is taking the Humalog as prescribed before meals and tells me today this has eliminated his hypoglycemia.   Family/Social History:  -Fhx: none reported -Tobacco: none reported -Alcohol: none reported  Current diabetes medications include: Trulicity 3 mg weekly, glargine 60 units daily, lispro 15 units TID  Patient reports adherence to taking all medications but takes more glargine than prescribed.   Insurance coverage: none  Patient denies hypoglycemic events.   Patient denies nocturia (nighttime urination).  Patient denies neuropathy (nerve pain). Patient denies visual  changes. Patient denies self foot exams.   Patient reported dietary habits:  -Have noticed that after dinner, his sugar is higher  -Sometimes he will have a snack before dinner  Patient-reported exercise habits: not discussed this visit   O:  Date of Download: data over the last 14 days  Time in Goal:  - Time in range 70-180: 63% - Time above range: 37% - Time below range: 0% Observed patterns: 3a - 6pm: 142 - 164 6p-12a: 188 - 207  Lab Results  Component Value Date   HGBA1C 9.4 (H) 11/28/2022   There were no vitals filed for this visit.  Lipid Panel  No results found for: "CHOL", "TRIG", "HDL", "CHOLHDL", "VLDL", "LDLCALC", "LDLDIRECT"  Clinical Atherosclerotic Cardiovascular Disease (ASCVD): No  The ASCVD Risk score (Arnett DK, et al., 2019) failed to calculate for the following reasons:   The 2019 ASCVD risk score is only valid for ages 74 to 31   Patient is participating in a Managed Medicaid Plan: No   A/P: Diabetes longstanding currently uncontrolled. Unfortunately, patient cannot afford GLP-1 RA at this time. This is really unfortunate, as his CGM data reveals good improvement. He is no longer feeling symptoms of hypoglycemia and is able to verbalize appropriate hypoglycemia management plan. Medication adherence appears is okay. Will need to increase Humalog to improve glycemic control with the loss of Trulicity.  -Continued Basaglar (insulin glargine) 60 units daily in the morning.  -Increased Humalog (insulin lispro) to 20 units three times daily before meals. Hold if pre-meal BG <150 or if skipping the meal.  -Stop GLP-1 RA due to cost.  -Patient educated on purpose, proper  use, and potential adverse effects of insulin.  -Extensively discussed pathophysiology of diabetes, recommended lifestyle interventions, dietary effects on blood sugar control.  -Counseled on s/sx of and management of hypoglycemia.  -Next A1c anticipated 02/2023.   Written patient  instructions provided. Patient verbalized understanding of treatment plan.  Total time in face to face counseling 30 minutes.    Follow-up:  Pharmacist:  In 1 month. PCP 02/20/2023.  Butch Penny, PharmD, [place], CPP Clinical Pharmacist [facility] & [facility] [phone]

## 2023-01-11 ENCOUNTER — Other Ambulatory Visit: Payer: Self-pay

## 2023-01-15 ENCOUNTER — Other Ambulatory Visit: Payer: Self-pay

## 2023-01-26 ENCOUNTER — Other Ambulatory Visit: Payer: Self-pay

## 2023-01-29 NOTE — Progress Notes (Unsigned)
S:     No chief complaint on file.  27 y.o. male who presents for diabetes evaluation, education, and management. PMH is significant for T2DM and Liver abscess. and Patient was referred and last seen by Primary Care Provider, Dr. Laural Benes on 10/17/22. Patient was last seen by Pharmacist, Butch Penny on 01/02/2023. At that visit, patient mentioned he is tolerating the Trulicity well. Denied any NV, abdominal pain, or changes in vision. Regarding his sensors, he mentioned his Josephine Igo sensors are working better. Unfortunately, per pharmacy, his evoucher were maxed out on the Trulicity. Additionally, Ozempic is non-formulary. His copay for GLP-1 RA therapy was cost-prohibitive. Additionally, he continued to take Basaglar 60u daily instead of reducing to 50u as rx'd. He was taking the Humalog as prescribed before meals and mentioned that helped eliminate his hypoglycemia. A1c on 11/28/2022 was 9.4% (down from 10% prior).   Today, patient arrives in good spirits and presents without any assistance. Patient denies dizziness, shakiness, tingling sensation in fingers and toes. Patient mentions he is taking the Humalog (insulin lispro) 15 units TID instead of 20 units TID. He states he was not told about the increase in his Humalog. Patient expresses interest in Trulicity but the copay is too expensive ($900).  Family/Social History:  -Fhx: none reported -Tobacco: none reported -Alcohol: none reported  Current diabetes medications include: Basaglar (insulin glargine) 60 units daily in the morning,  Humalog (insulin lispro) 20 units three times daily before meals   Patient denies adherence with medications, reports missing  medications 1-2  times per week, on average of the Humalog.  Insurance coverage: none   Patient denies hypoglycemic events.  Reported home blood sugars: 155-160. highest: 220 (ate a cookie)    Patient denies nocturia (nighttime urination).  Patient denies neuropathy (nerve  pain). Patient denies visual changes. Patient self foot exams- not discussed today   Patient reported dietary habits: Eats 3 meals/day Breakfast: eggs, vegetables, smoothie Lunch: salmon, fish, meat Dinner: vegetables Snacks: fruits, seeds, nuts Drinks: water, sparkling water   Patient-reported exercise habits: 3-4 times a week run, walk or volleyball   O:  7 day average blood glucose: Does not have data with him today due to phone not working.    Lab Results  Component Value Date   HGBA1C 9.4 (H) 11/28/2022   There were no vitals filed for this visit.  Lipid Panel  No results found for: "CHOL", "TRIG", "HDL", "CHOLHDL", "VLDL", "LDLCALC", "LDLDIRECT"  Clinical Atherosclerotic Cardiovascular Disease (ASCVD): No  The ASCVD Risk score (Arnett DK, et al., 2019) failed to calculate for the following reasons:   The 2019 ASCVD risk score is only valid for ages 2 to 20   A/P: Diabetes longstanding currently uncontrolled. Patient is  able to verbalize appropriate hypoglycemia management plan. Medication adherence appears optimal. Control is suboptimal due to medications not fully optimized and suboptimal adherence. -Adjusted Humalog (insulin lispro) from 15 units to 20 units TID before meals. -Continued Basaglar (insulin glargine) 60 units daily in the morning. -Labs today: lipid panel -Patient educated on purpose, proper use, and potential adverse effects of medications.  -Extensively discussed pathophysiology of diabetes, recommended lifestyle interventions, dietary effects on blood sugar control.  -Counseled on s/sx of and management of hypoglycemia.  -Next A1c anticipated 02/2023.   Written patient instructions provided. Patient verbalized understanding of treatment plan.  Total time in face to face counseling 30 minutes.    Follow-up:  Pharmacist: 03/20/2023 PCP clinic visit: 02/20/23  Patient seen with:  Erasmo Leventhal, PharmD Candidate  Class of 20[employer]  SOP   Butch Penny, PharmD, [place], CPP Clinical Pharmacist [facility] [phone]

## 2023-01-30 ENCOUNTER — Encounter: Payer: Self-pay | Admitting: Pharmacist

## 2023-01-30 ENCOUNTER — Telehealth: Payer: Self-pay | Admitting: Pharmacist

## 2023-01-30 ENCOUNTER — Other Ambulatory Visit: Payer: Self-pay

## 2023-01-30 ENCOUNTER — Ambulatory Visit: Payer: Self-pay | Attending: Family Medicine | Admitting: Pharmacist

## 2023-01-30 DIAGNOSIS — E1129 Type 2 diabetes mellitus with other diabetic kidney complication: Secondary | ICD-10-CM

## 2023-01-30 DIAGNOSIS — R809 Proteinuria, unspecified: Secondary | ICD-10-CM

## 2023-01-30 DIAGNOSIS — Z794 Long term (current) use of insulin: Secondary | ICD-10-CM

## 2023-01-30 MED ORDER — FREESTYLE LIBRE 3 READER DEVI
0 refills | Status: DC
Start: 2023-01-30 — End: 2023-02-12
  Filled 2023-01-30: qty 1, 30d supply, fill #0

## 2023-01-30 MED ORDER — FREESTYLE LIBRE 3 PLUS SENSOR MISC
6 refills | Status: DC
Start: 2023-01-30 — End: 2023-02-12
  Filled 2023-01-30: qty 2, 30d supply, fill #0

## 2023-01-30 NOTE — Telephone Encounter (Signed)
Can we get PA approval for Lenora 3?

## 2023-01-31 ENCOUNTER — Other Ambulatory Visit: Payer: Self-pay

## 2023-01-31 LAB — LIPID PANEL
Chol/HDL Ratio: 6.2 ratio — ABNORMAL HIGH (ref 0.0–5.0)
Cholesterol, Total: 267 mg/dL — ABNORMAL HIGH (ref 100–199)
HDL: 43 mg/dL (ref >39)
LDL Chol Calc (NIH): 172 mg/dL — ABNORMAL HIGH (ref 0–99)
Triglycerides: 271 mg/dL — ABNORMAL HIGH (ref 0–149)
VLDL Cholesterol Cal: 52 mg/dL — ABNORMAL HIGH (ref 5–40)

## 2023-01-31 NOTE — Progress Notes (Signed)
Let patient know that his cholesterol level is elevated at 172 with goal being less than 70.  I recommend starting low-dose of a cholesterol-lowering medication called atorvastatin 10 mg daily.  We will need to keep check on liver function test via blood draw while he is on the medication as it can sometimes affect the liver.  The medicine sometimes may also cause cramps.  Let me know if he is willing to take the cholesterol-lowering medication then I will send the prescription to our pharmacy.

## 2023-02-01 ENCOUNTER — Other Ambulatory Visit: Payer: Self-pay

## 2023-02-02 ENCOUNTER — Telehealth: Payer: Self-pay

## 2023-02-02 ENCOUNTER — Other Ambulatory Visit: Payer: Self-pay

## 2023-02-02 NOTE — Telephone Encounter (Signed)
-----   Message from [facility] Dorianna Mckiver M sent at 02/01/2023 10:29 AM EST -----  ----- Message ----- From: Marcine Matar, MD Sent: 01/31/2023   9:24 AM EST To: Johna Roles, CMA  Let patient know that his cholesterol level is elevated at 172 with goal being less than 70.  I recommend starting low-dose of a cholesterol-lowering medication called atorvastatin 10 mg daily.  We will need to keep check on liver function test via blood draw while he is on the medication as it can sometimes affect the liver.  The medicine sometimes may also cause cramps.  Let me know if he is willing to take the cholesterol-lowering medication then I will send the prescription to our pharmacy.

## 2023-02-02 NOTE — Telephone Encounter (Signed)
-----   Message from [facility] Abiha Lukehart M sent at 02/01/2023 10:29 AM EST -----  ----- Message ----- From: Marcine Matar, MD Sent: 01/31/2023   9:24 AM EST To: Johna Roles, CMA  Let patient know that his cholesterol level is elevated at 172 with goal being less than 70.  I recommend starting low-dose of a cholesterol-lowering medication called atorvastatin 10 mg daily.  We will need to keep check on liver function test via blood draw while he is on the medication as it can sometimes affect the liver.  The medicine sometimes may also cause cramps.  Let me know if he is willing to take the cholesterol-lowering medication then I will send the prescription to our pharmacy.

## 2023-02-02 NOTE — Telephone Encounter (Signed)
Pt was called twice and no vm was left due to mailbox being full. Information has been sent to nurse pool.  Interpreter id # Lemmie Evens [phone]

## 2023-02-09 ENCOUNTER — Other Ambulatory Visit: Payer: Self-pay

## 2023-02-12 ENCOUNTER — Other Ambulatory Visit: Payer: Self-pay

## 2023-02-12 ENCOUNTER — Other Ambulatory Visit: Payer: Self-pay | Admitting: Pharmacist

## 2023-02-12 MED ORDER — DEXCOM G7 RECEIVER DEVI
0 refills | Status: DC
  Filled 2023-02-12: qty 1, 30d supply, fill #0
  Filled 2023-06-25 – 2023-06-26 (×2): qty 1, 365d supply, fill #0

## 2023-02-12 MED ORDER — DEXCOM G7 SENSOR MISC
6 refills | Status: DC
  Filled 2023-02-12 – 2023-06-25 (×2): qty 3, 30d supply, fill #0

## 2023-02-20 ENCOUNTER — Ambulatory Visit: Payer: Self-pay | Attending: Internal Medicine | Admitting: Internal Medicine

## 2023-02-20 ENCOUNTER — Encounter: Payer: Self-pay | Admitting: Internal Medicine

## 2023-02-20 ENCOUNTER — Other Ambulatory Visit: Payer: Self-pay

## 2023-02-20 VITALS — BP 133/85 | HR 104 | Temp 98.7°F | Ht 66.0 in | Wt 188.0 lb

## 2023-02-20 DIAGNOSIS — R030 Elevated blood-pressure reading, without diagnosis of hypertension: Secondary | ICD-10-CM

## 2023-02-20 DIAGNOSIS — Z794 Long term (current) use of insulin: Secondary | ICD-10-CM

## 2023-02-20 DIAGNOSIS — E785 Hyperlipidemia, unspecified: Secondary | ICD-10-CM

## 2023-02-20 DIAGNOSIS — E1169 Type 2 diabetes mellitus with other specified complication: Secondary | ICD-10-CM

## 2023-02-20 DIAGNOSIS — R809 Proteinuria, unspecified: Secondary | ICD-10-CM

## 2023-02-20 DIAGNOSIS — E1129 Type 2 diabetes mellitus with other diabetic kidney complication: Secondary | ICD-10-CM

## 2023-02-20 DIAGNOSIS — Z23 Encounter for immunization: Secondary | ICD-10-CM

## 2023-02-20 MED ORDER — ATORVASTATIN CALCIUM 10 MG PO TABS
10.0000 mg | ORAL_TABLET | Freq: Every day | ORAL | 1 refills | Status: DC
Start: 2023-02-20 — End: 2023-02-22
  Filled 2023-02-20: qty 30, 30d supply, fill #0

## 2023-02-20 MED ORDER — BASAGLAR KWIKPEN 100 UNIT/ML ~~LOC~~ SOPN
66.0000 [IU] | PEN_INJECTOR | Freq: Every day | SUBCUTANEOUS | 6 refills | Status: DC
  Filled 2023-02-20 – 2023-03-08 (×2): qty 18, 27d supply, fill #0

## 2023-02-20 NOTE — Progress Notes (Signed)
Patient ID: Adam Skinner, male    DOB: December 30, 1995  MRN: [id number]  CC: Diabetes (DM f/u. Med refill./Reports high BS readings /Flu vax administered on 02/20/23 - C.A.)   Subjective: Adam Skinner is a 27 y.o. male who presents for chronic ds management. 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 #[id number], [name]   DM: Lab Results  Component Value Date   HGBA1C 9.4 (H) 11/28/2022  Confirms taking Humalog 20 units TID with meals and Basaglar 60 units daily in a.m -has CGM [place] app on cell phone: over past 1 wk, 87% of BS reading have been >240 Past 2wks BS >240 60% BS in a.m before BF always >200.   -pt reports that his BS have been high.  He has been changing his diet and exercising but BS remains high. Reports he has taken carbs out of diet -bread and rice.  Drinks mainly sparkling water.   Walks on treadmill and also runs at a nearby part 2-3x/wk Over due for eye exam.  No insurance or [facility] financial.   Lipid profile done 1 mth ago showed LDL cholesterol 172.  I recommended starting Lipitor but we were unable to reach him via phone.    HM:  yes to flu shot    Patient Active Problem List   Diagnosis Date Noted   Type 2 diabetes mellitus with microalbuminuria, with long-term current use of insulin (HCC) 01/04/2022   Hyponatremia 08/31/2021   Bandemia 08/31/2021   Normocytic anemia 08/31/2021   Language barrier 08/31/2021   Epigastric pain    Liver abscess 08/28/2021   Uncontrolled diabetes mellitus with hyperglycemia, without long-term current use of insulin (HCC) 08/28/2021     Current Outpatient Medications on File Prior to Visit  Medication Sig Dispense Refill   blood glucose meter kit and supplies Use up to 4 times a day as directed 1 each 0   Continuous Glucose Receiver (DEXCOM G7  RECEIVER) DEVI Use to check blood glucose continuously. 1 each 0   Continuous Glucose Sensor (DEXCOM G7 SENSOR) MISC Use to check blood glucose throughout the day. Changes sensors once every 10 days. 3 each 6   glucose blood (TRUE METRIX BLOOD GLUCOSE TEST) test strip use up to 4 times a day as directed 200 each 0   insulin lispro (HUMALOG) 100 UNIT/ML KwikPen Inject 20 Units into the skin 3 (three) times daily. 15 mL 3   Insulin Pen Needle 32G X 6 MM MISC Use 4 (four) times daily -  before meals and at bedtime. 200 each 1   TRUEplus Lancets 28G MISC use up to 4 times a day as directed 200 each 0   No current facility-administered medications on file prior to visit.    Allergies  Allergen Reactions   Pork-Derived Products     Social History   Socioeconomic History   Marital status: Married    Spouse name: Not on file   Number of children: Not on file   Years of education: Not on file   Highest education level: Not on file  Occupational History   Not on file  Tobacco Use   Smoking status: Never   Smokeless tobacco: Never  Vaping Use   Vaping status: Never Used  Substance and Sexual Activity   Alcohol use: Not Currently   Drug use: Never  Sexual activity: Not on file  Other Topics Concern   Not on file  Social History Narrative   Not on file   Social Determinants of Health   Financial Resource Strain: Medium Risk (02/20/2023)   Overall Financial Resource Strain (CARDIA)    Difficulty of Paying Living Expenses: Somewhat hard  Food Insecurity: Food Insecurity Present (02/20/2023)   Hunger Vital Sign    Worried About Running Out of Food in the Last Year: Sometimes true    Ran Out of Food in the Last Year: Sometimes true  Transportation Needs: No Transportation Needs (02/20/2023)   PRAPARE - Administrator, Civil Service (Medical): No    Lack of Transportation (Non-Medical): No  Physical Activity: Insufficiently Active (02/20/2023)   Exercise Vital Sign     Days of Exercise per Week: 2 days    Minutes of Exercise per Session: 30 min  Stress: No Stress Concern Present (02/20/2023)   [employer] of Occupational Health - Occupational Stress Questionnaire    Feeling of Stress : Not at all  Social Connections: Moderately Integrated (02/20/2023)   Social Connection and Isolation Panel [NHANES]    Frequency of Communication with Friends and Family: Twice a week    Frequency of Social Gatherings with Friends and Family: More than three times a week    Attends Religious Services: Never    Database administrator or Organizations: Yes    Attends Banker Meetings: 1 to 4 times per year    Marital Status: Married  Catering manager Violence: Not At Risk (02/20/2023)   Humiliation, Afraid, Rape, and Kick questionnaire    Fear of Current or Ex-Partner: No    Emotionally Abused: No    Physically Abused: No    Sexually Abused: No    Family History  Problem Relation Age of Onset   Diabetes Mother    Diabetes Father    Heart disease Neg Hx     Past Surgical History:  Procedure Laterality Date   IR RADIOLOGIST EVAL & MGMT  09/15/2021   IR RADIOLOGIST EVAL & MGMT  09/27/2021   LIVER SURGERY      ROS: Review of Systems Negative except as stated above  PHYSICAL EXAM: BP 133/85   Pulse (!) 104   Temp 98.7 F (37.1 C) (Oral)   Ht 5\' 6"  (1.676 m)   Wt 188 lb (85.3 kg)   SpO2 98%   BMI 30.34 kg/m   Physical Exam   General appearance - alert, well appearing, and in no distress Mental status - normal mood, behavior, speech, dress, motor activity, and thought processes Neck - supple, no significant adenopathy Chest - clear to auscultation, no wheezes, rales or rhonchi, symmetric air entry Heart - normal rate, regular rhythm, normal S1, S2, no murmurs, rubs, clicks or gallops Extremities - peripheral pulses normal, no pedal edema, no clubbing or cyanosis Diabetic Foot Exam - Simple   Simple Foot Form Diabetic Foot exam was  performed with the following findings: Yes 02/20/2023  2:04 PM  Visual Inspection See comments: Yes Sensation Testing Intact to touch and monofilament testing bilaterally: Yes Pulse Check Posterior Tibialis and Dorsalis pulse intact bilaterally: Yes Comments Flat footed        Latest Ref Rng & Units 10/19/2022    8:55 AM 09/06/2021    4:27 AM 09/04/2021    4:48 AM  CMP  Glucose 70 - 99 mg/dL 161  096  045   BUN 6 - 20 mg/dL  18  9  10    Creatinine 0.76 - 1.27 mg/dL 1.61  0.96  0.45   Sodium 134 - 144 mmol/L 138  139  137   Potassium 3.5 - 5.2 mmol/L 3.9  3.7  3.8   Chloride 96 - 106 mmol/L 100  101  98   CO2 20 - 29 mmol/L 25  30  31    Calcium 8.7 - 10.2 mg/dL 9.8  9.0  9.0   Total Protein 6.0 - 8.5 g/dL 7.1     Total Bilirubin 0.0 - 1.2 mg/dL 0.2     Alkaline Phos 44 - 121 IU/L 105     AST 0 - 40 IU/L 28     ALT 0 - 44 IU/L 34      Lipid Panel     Component Value Date/Time   CHOL 267 (H) 01/30/2023 1429   TRIG 271 (H) 01/30/2023 1429   HDL 43 01/30/2023 1429   CHOLHDL 6.2 (H) 01/30/2023 1429   LDLCALC 172 (H) 01/30/2023 1429    CBC    Component Value Date/Time   WBC 10.3 10/19/2022 0855   WBC 7.6 09/06/2021 0427   RBC 5.40 10/19/2022 0855   RBC 4.02 (L) 09/06/2021 0427   HGB 15.5 10/19/2022 0855   HCT 46.3 10/19/2022 0855   PLT 356 10/19/2022 0855   MCV 86 10/19/2022 0855   MCH 28.7 10/19/2022 0855   MCH 26.4 09/06/2021 0427   MCHC 33.5 10/19/2022 0855   MCHC 31.4 09/06/2021 0427   RDW 12.2 10/19/2022 0855   LYMPHSABS 2.5 10/03/2021 1040   MONOABS 1.2 (H) 09/01/2021 0432   EOSABS 0.0 10/03/2021 1040   BASOSABS 0.0 10/03/2021 1040    ASSESSMENT AND PLAN: 1. Type 2 diabetes mellitus with microalbuminuria, with long-term current use of insulin (HCC) Blood sugars are not at goal. Increase Basaglar insulin to 66 units daily.  Continue Humalog 20 units with meals.  Encouraged him to continue to try to eat healthy. - Insulin Glargine (BASAGLAR KWIKPEN) 100  UNIT/ML; Inject 66 Units into the skin daily.  Dispense: 18 mL; Refill: 6 - Microalbumin / creatinine urine ratio  2. Hyperlipidemia associated with type 2 diabetes mellitus (HCC) Start atorvastatin 10 mg daily.  Went over with him potential side effects including drug-induced hepatitis.  He will stop at the lab today to have liver function test done.  He is agreeable to starting the medication. - Hepatic Function Panel - atorvastatin (LIPITOR) 10 MG tablet; Take 1 tablet (10 mg total) by mouth daily.  Dispense: 90 tablet; Refill: 1  3. Elevated blood-pressure reading without diagnosis of hypertension DASH diet discussed and encouraged.  Follow-up with clinical pharmacist in 2 weeks for recheck.  4. Encounter for immunization - Flu vaccine trivalent PF, 6mos and older(Flulaval,Afluria,Fluarix,Fluzone)   Patient was given the opportunity to ask questions.  Patient verbalized understanding of the plan and was able to repeat key elements of the plan.   This documentation was completed using Paediatric nurse.  Any transcriptional errors are unintentional.  Orders Placed This Encounter  Procedures   Flu vaccine trivalent PF, 6mos and older(Flulaval,Afluria,Fluarix,Fluzone)   Hepatic Function Panel   Microalbumin / creatinine urine ratio     Requested Prescriptions   Signed Prescriptions Disp Refills   Insulin Glargine (BASAGLAR KWIKPEN) 100 UNIT/ML 18 mL 6    Sig: Inject 66 Units into the skin daily.   atorvastatin (LIPITOR) 10 MG tablet 90 tablet 1    Sig: Take 1 tablet (  10 mg total) by mouth daily.    Return in about 3 months (around 05/21/2023) for BP check Luke in 2 wks.  Jonah Blue, MD, FACP

## 2023-02-20 NOTE — Patient Instructions (Signed)
Increased Basaglar insulin to 66 units daily. Your blood pressure is elevated.  We will have you follow-up with the clinical pharmacist in about 2 weeks for repeat blood pressure check.  Try to limit salt in the foods.  Please apply for the orange card/[facility] financial so that we can refer you for an eye exam.

## 2023-02-21 ENCOUNTER — Other Ambulatory Visit: Payer: Self-pay

## 2023-02-22 ENCOUNTER — Other Ambulatory Visit: Payer: Self-pay | Admitting: Internal Medicine

## 2023-02-22 DIAGNOSIS — R7989 Other specified abnormal findings of blood chemistry: Secondary | ICD-10-CM

## 2023-02-22 LAB — HEPATIC FUNCTION PANEL
ALT: 78 [IU]/L — ABNORMAL HIGH (ref 0–44)
AST: 37 [IU]/L (ref 0–40)
Albumin: 4.5 g/dL (ref 4.3–5.2)
Alkaline Phosphatase: 128 [IU]/L — ABNORMAL HIGH (ref 44–121)
Bilirubin Total: 0.3 mg/dL (ref 0.0–1.2)
Bilirubin, Direct: 0.09 mg/dL (ref 0.00–0.40)
Total Protein: 8.1 g/dL (ref 6.0–8.5)

## 2023-02-22 LAB — MICROALBUMIN / CREATININE URINE RATIO
Creatinine, Urine: 48.1 mg/dL
Microalb/Creat Ratio: 112 mg/g{creat} — ABNORMAL HIGH (ref 0–29)
Microalbumin, Urine: 53.8 ug/mL

## 2023-02-22 NOTE — Progress Notes (Signed)
Let patient know that he has mild elevation in 2 of his liver function test.  I would like for him to return to the lab to have additional studies done to evaluate this further.  Hold off on taking the cholesterol medication the atorvastatin for now.  He has small amount of protein in the urine due to uncontrolled diabetes. This indicates that diabetes is affecting the kidneys. Hopefully this will correct with better control of his diabetes.  We will recheck this again in 4 months.

## 2023-02-23 ENCOUNTER — Other Ambulatory Visit: Payer: Self-pay

## 2023-02-26 ENCOUNTER — Ambulatory Visit: Payer: Self-pay | Attending: Family Medicine

## 2023-02-26 DIAGNOSIS — R7989 Other specified abnormal findings of blood chemistry: Secondary | ICD-10-CM

## 2023-02-27 LAB — HEPATITIS B SURFACE ANTIGEN: Hepatitis B Surface Ag: NEGATIVE

## 2023-02-27 LAB — HEPATITIS B SURFACE ANTIBODY, QUANTITATIVE: Hepatitis B Surf Ab Quant: <3.5 m[IU]/mL — ABNORMAL LOW

## 2023-02-27 LAB — HEPATITIS C ANTIBODY: Hep C Virus Ab: NONREACTIVE

## 2023-03-02 ENCOUNTER — Other Ambulatory Visit: Payer: Self-pay

## 2023-03-08 ENCOUNTER — Ambulatory Visit: Payer: Self-pay | Attending: Internal Medicine | Admitting: Pharmacist

## 2023-03-08 ENCOUNTER — Encounter: Payer: Self-pay | Admitting: Pharmacist

## 2023-03-08 ENCOUNTER — Ambulatory Visit: Payer: Self-pay

## 2023-03-08 ENCOUNTER — Other Ambulatory Visit: Payer: Self-pay

## 2023-03-08 VITALS — BP 135/86 | HR 93

## 2023-03-08 DIAGNOSIS — E1129 Type 2 diabetes mellitus with other diabetic kidney complication: Secondary | ICD-10-CM

## 2023-03-08 DIAGNOSIS — R030 Elevated blood-pressure reading, without diagnosis of hypertension: Secondary | ICD-10-CM

## 2023-03-08 DIAGNOSIS — Z794 Long term (current) use of insulin: Secondary | ICD-10-CM

## 2023-03-08 DIAGNOSIS — R809 Proteinuria, unspecified: Secondary | ICD-10-CM

## 2023-03-08 MED ORDER — LOSARTAN POTASSIUM 25 MG PO TABS
25.0000 mg | ORAL_TABLET | Freq: Every day | ORAL | 2 refills | Status: DC
Start: 2023-03-08 — End: 2023-05-21
  Filled 2023-03-08: qty 30, 30d supply, fill #0
  Filled 2023-04-24 – 2023-05-17 (×2): qty 30, 30d supply, fill #1

## 2023-03-08 NOTE — Progress Notes (Signed)
S:     No chief complaint on file.  27 y.o. male who presents for HTN and diabetes evaluation, education, and management. PMH is significant for T2DM and Liver abscess.   Patient was referred and last seen by Primary Care Provider, Dr. Laural Benes, on 02/20/2023. Patient was last seen by pharmacy team on 01/30/2023.   With Dr. Laural Benes on 02/20/23 BP was 133/85. He did not have a diagnosis of HTN at that visit but BP was in the range for stage I HTN. Additionally, He had a positive urine screen for microalbuminuria with an elevated creatinine to microalbumin ratio. He was referred to me for a BP check today. He does not currently take any antihypertensive medications. He denies any complaints today. Denies any NV, abdominal pain, or changes in vision. Denies any chest pains, dyspnea, HA, or lightheadedness. He does request refills for his insulins and CGM sensors.    Family/Social History:  -Fhx: none reported -Tobacco: never smoker  -Alcohol: "not consistently"   Current diabetes medications include: Basaglar (insulin glargine) 60 units daily in the morning,  Humalog (insulin lispro) 20 units three times daily before meals  Current HTN medications include: none   Patient denies adherence with medications, reports missing  medications 1-2  times per week, on average of the Humalog.  Insurance coverage: none   Patient denies hypoglycemic events.  Reported home blood sugars: 155-160. highest: 220 (ate a cookie)    Patient denies nocturia (nighttime urination).  Patient denies neuropathy (nerve pain). Patient denies visual changes. Patient self foot exams- not discussed today   Patient reported dietary habits: Eats 3 meals/day Breakfast: eggs, vegetables, smoothie Lunch: salmon, fish, meat Dinner: vegetables Snacks: fruits, seeds, nuts Drinks: water, sparkling water   Patient-reported exercise habits: 3-4 times a week run, walk or volleyball   O:  Lab Results  Component Value  Date   HGBA1C 9.4 (H) 11/28/2022   Vitals:   03/08/23 1504  BP: 135/86  Pulse: 93   Last 3 Office BP readings: BP Readings from Last 3 Encounters:  03/08/23 135/86  02/20/23 133/85  10/17/22 122/77    BMET    Component Value Date/Time   NA 138 10/19/2022 0855   K 3.9 10/19/2022 0855   CL 100 10/19/2022 0855   CO2 25 10/19/2022 0855   GLUCOSE 145 (H) 10/19/2022 0855   GLUCOSE 172 (H) 09/06/2021 0427   BUN 18 10/19/2022 0855   CREATININE 0.94 10/19/2022 0855   CALCIUM 9.8 10/19/2022 0855   GFRNONAA >60 09/06/2021 0427   Lipid Panel     Component Value Date/Time   CHOL 267 (H) 01/30/2023 1429   TRIG 271 (H) 01/30/2023 1429   HDL 43 01/30/2023 1429   CHOLHDL 6.2 (H) 01/30/2023 1429   LDLCALC 172 (H) 01/30/2023 1429    Clinical Atherosclerotic Cardiovascular Disease (ASCVD): No  The ASCVD Risk score (Arnett DK, et al., 2019) failed to calculate for the following reasons:   The 2019 ASCVD risk score is only valid for ages 63 to 49   A/P: Hypertension: pt now with elevated BP at two separate office visits meeting criteria for hypertension. He is not currently on medications for BP. BP goal < 130/80 mmHg. Given his elevated UACR, we will start a RAAS agent today.  -Started losartan 25 mg daily.  -Patient educated on purpose, proper use, and potential adverse effects of losartan.  -F/u labs ordered - none today, anticipate BMP at follow-up.  -Counseled on lifestyle modifications for blood  pressure control including reduced dietary sodium, increased exercise, adequate sleep. -Encouraged patient to check BP at home and bring log of readings to next visit. Counseled on proper use of home BP cuff.   Diabetes longstanding currently uncontrolled. Patient is  able to verbalize appropriate hypoglycemia management plan. Medication adherence appears optimal. Control is suboptimal due to medications not fully optimized and suboptimal adherence. -Continued Humalog and Basaglar at  current doses. Encouraged compliance - pt to pick-up medications today.  -Patient educated on purpose, proper use, and potential adverse effects of medications.  -Extensively discussed pathophysiology of diabetes, recommended lifestyle interventions, dietary effects on blood sugar control.  -Counseled on s/sx of and management of hypoglycemia.  -A1c (venous) taken today  Written patient instructions provided. Patient verbalized understanding of treatment plan.  Total time in face to face counseling 30 minutes.    Follow-up:  Pharmacist: 03/20/2023   Butch Penny, PharmD, BCACP, CPP Clinical Pharmacist [facility] [phone]

## 2023-03-09 LAB — HEMOGLOBIN A1C
Est. average glucose Bld gHb Est-mCnc: 260 mg/dL
Hgb A1c MFr Bld: 10.7 % — ABNORMAL HIGH (ref 4.8–5.6)

## 2023-03-15 ENCOUNTER — Ambulatory Visit: Payer: Self-pay | Attending: Internal Medicine

## 2023-03-15 DIAGNOSIS — Z23 Encounter for immunization: Secondary | ICD-10-CM

## 2023-03-16 ENCOUNTER — Other Ambulatory Visit: Payer: Self-pay

## 2023-03-20 ENCOUNTER — Other Ambulatory Visit: Payer: Self-pay

## 2023-03-20 ENCOUNTER — Ambulatory Visit: Payer: Self-pay | Attending: Family Medicine | Admitting: Pharmacist

## 2023-03-20 ENCOUNTER — Encounter: Payer: Self-pay | Admitting: Pharmacist

## 2023-03-20 VITALS — BP 122/76

## 2023-03-20 DIAGNOSIS — Z794 Long term (current) use of insulin: Secondary | ICD-10-CM

## 2023-03-20 DIAGNOSIS — R809 Proteinuria, unspecified: Secondary | ICD-10-CM

## 2023-03-20 DIAGNOSIS — R030 Elevated blood-pressure reading, without diagnosis of hypertension: Secondary | ICD-10-CM

## 2023-03-20 DIAGNOSIS — E1129 Type 2 diabetes mellitus with other diabetic kidney complication: Secondary | ICD-10-CM

## 2023-03-20 MED ORDER — TRULICITY 0.75 MG/0.5ML ~~LOC~~ SOAJ
0.7500 mg | SUBCUTANEOUS | 1 refills | Status: DC
  Filled 2023-03-20 – 2023-04-24 (×2): qty 2, 28d supply, fill #0

## 2023-03-20 NOTE — Progress Notes (Signed)
 S:     No chief complaint on file.  28 y.o. male who presents for HTN and diabetes evaluation, education, and management. PMH is significant for T2DM and Liver abscess.   Patient was referred and last seen by Primary Care Provider, Dr. Vicci, on 02/20/2023. Patient was last seen by pharmacy team on 03/08/23. I added losartan  at that time.   With Dr. Vicci on 02/20/23 BP was 133/85. He did not have a diagnosis of HTN at that visit but BP was in the range for stage I HTN. I saw him on 03/08/2023 and BP was again elevated at 135/86 mmHg meeting criteria for Stage I HTN. This coupled with his elevated UACR indicated RAAS therapy.   Today, he endorses adherence to his losartan . He denies any complaints today. Denies any NV, abdominal pain, or changes in vision. Denies any chest pains, dyspnea, HA, or lightheadedness.  Family/Social History:  -Fhx: none reported -Tobacco: never smoker  -Alcohol: not consistently   Current diabetes medications include: Basaglar  (insulin  glargine) 66 units daily in the morning,  Humalog  (insulin  lispro) 20 units three times daily before meals  Current HTN medications include: losartan  25 mg daily   Patient reports adherence with medications.  Insurance coverage: none   Patient denies hypoglycemic events.  Reported home blood sugars: recalls 1 reading of  216 since last visit.   Patient denies nocturia (nighttime urination).  Patient denies neuropathy (nerve pain). Patient denies visual changes. Patient self foot exams- not discussed today   Patient reported dietary habits: Eats 3 meals/day Breakfast: eggs, vegetables, smoothie Lunch: salmon, fish, meat Dinner: vegetables Snacks: fruits, seeds, nuts Drinks: water, sparkling water   Patient-reported exercise habits: 3-4 times a week run, walk or volleyball   O:  Lab Results  Component Value Date   HGBA1C 10.7 (H) 03/08/2023   Vitals:   03/20/23 1434  BP: 122/76    Last 3 Office  BP readings: BP Readings from Last 3 Encounters:  03/08/23 135/86  02/20/23 133/85  10/17/22 122/77    BMET    Component Value Date/Time   NA 138 10/19/2022 0855   K 3.9 10/19/2022 0855   CL 100 10/19/2022 0855   CO2 25 10/19/2022 0855   GLUCOSE 145 (H) 10/19/2022 0855   GLUCOSE 172 (H) 09/06/2021 0427   BUN 18 10/19/2022 0855   CREATININE 0.94 10/19/2022 0855   CALCIUM  9.8 10/19/2022 0855   GFRNONAA >60 09/06/2021 0427   Lipid Panel     Component Value Date/Time   CHOL 267 (H) 01/30/2023 1429   TRIG 271 (H) 01/30/2023 1429   HDL 43 01/30/2023 1429   CHOLHDL 6.2 (H) 01/30/2023 1429   LDLCALC 172 (H) 01/30/2023 1429    Clinical Atherosclerotic Cardiovascular Disease (ASCVD): No  The ASCVD Risk score (Arnett DK, et al., 2019) failed to calculate for the following reasons:   The 2019 ASCVD risk score is only valid for ages 69 to 75   A/P: Hypertension: pt now with elevated BP at two separate office visits meeting criteria for hypertension. He is adherent to losartan . BP goal < 130/80 mmHg.  -Continue losartan  25 mg daily.  -Patient educated on purpose, proper use, and potential adverse effects of losartan .  -F/u labs ordered - BMP8+eGFR -Counseled on lifestyle modifications for blood pressure control including reduced dietary sodium, increased exercise, adequate sleep. -Encouraged patient to check BP at home and bring log of readings to next visit. Counseled on proper use of home BP cuff.  Diabetes longstanding currently uncontrolled. Patient is able to verbalize appropriate hypoglycemia management plan. Medication adherence appears optimal. He had better control with Trulicity  and with the new calendar year, we may be able to fill Trulicity . Rxn sent to see if he can afford this. I see him again in 1 month. If he is unable to afford Trulicity , we can titrate insulin  and consider adding metformin.  -Continued Humalog  and Basaglar  at current doses. Encouraged  compliance. -Start Trulicity  0.75 mg weekly.  -Patient educated on purpose, proper use, and potential adverse effects of medications.  -Extensively discussed pathophysiology of diabetes, recommended lifestyle interventions, dietary effects on blood sugar control.  -Counseled on s/sx of and management of hypoglycemia.  -A1c anticipated 05/2023.   Written patient instructions provided. Patient verbalized understanding of treatment plan.  Total time in face to face counseling 30 minutes.    Follow-up:  Pharmacist: in 1 month.   [name], PharmD, JAQUELINE, CPP Clinical Pharmacist [facility] [phone]

## 2023-03-21 LAB — BMP8+EGFR
BUN/Creatinine Ratio: 14 (ref 9–20)
BUN: 15 mg/dL (ref 6–20)
CO2: 26 mmol/L (ref 20–29)
Calcium: 9.9 mg/dL (ref 8.7–10.2)
Chloride: 100 mmol/L (ref 96–106)
Creatinine, Ser: 1.09 mg/dL (ref 0.76–1.27)
Glucose: 369 mg/dL — ABNORMAL HIGH (ref 70–99)
Potassium: 4.5 mmol/L (ref 3.5–5.2)
Sodium: 140 mmol/L (ref 134–144)
eGFR: 96 mL/min/{1.73_m2} (ref >59)

## 2023-03-29 ENCOUNTER — Other Ambulatory Visit: Payer: Self-pay

## 2023-04-23 NOTE — Progress Notes (Signed)
S:     No chief complaint on file.  28 y.o. male who presents for HTN and diabetes evaluation, education, and management. PMH is significant for T2DM and Liver abscess.   Patient was referred and last seen by Primary Care Provider, Dr. Laural Benes, on 02/20/2023. Mild elevation was detected in LFT. Atorvastatin was held. Patient was last seen by pharmacy team on 03/20/23. BP was 122/76 on losartan only. Trulicity 0.75 mg weekly was added for DM. Adherence for insulin was encouraged.   Today, pt tells [place] he did not get the Trulicity. His copay is prohibitive, >$900. He endorses adherence to his insulin but his fill history suggests otherwise. His Humalog was last filled in 09/2022 for a 30-day supply. There are also gaps in refills for his Basaglar.   Family/Social History:  -Fhx: none reported -Tobacco: never smoker  -Alcohol: "not consistently"   Current diabetes medications include: Basaglar (insulin glargine) 66 units daily in the morning,  Humalog (insulin lispro) 20 units three times daily before meals Current HTN medications include: losartan 25 mg daily   Insurance coverage: none   Patient denies hypoglycemic events.  Reported home fasting blood sugars: none reported   Patient denies nocturia (nighttime urination).  Patient denies neuropathy (nerve pain). Patient denies visual changes.  Patient reported dietary habits: Eats 2 meals/day Breakfast: eggs, vegetables, smoothie Dinner: vegetables, salmon, fish, meat Snacks: fruits, seeds, nuts Drinks: water, sparkling water   Patient-reported exercise habits: none  O:  Lab Results  Component Value Date   HGBA1C 10.7 (H) 03/08/2023    BMET    Component Value Date/Time   NA 140 03/20/2023 1448   K 4.5 03/20/2023 1448   CL 100 03/20/2023 1448   CO2 26 03/20/2023 1448   GLUCOSE 369 (H) 03/20/2023 1448   GLUCOSE 172 (H) 09/06/2021 0427   BUN 15 03/20/2023 1448   CREATININE 1.09 03/20/2023 1448   CALCIUM 9.9 03/20/2023  1448   EGFR 96 03/20/2023 1448   GFRNONAA >60 09/06/2021 0427     Lipid panel     Component Value Date/Time   CHOL 267 (H) 01/30/2023 1429   TRIG 271 (H) 01/30/2023 1429   HDL 43 01/30/2023 1429   CHOLHDL 6.2 (H) 01/30/2023 1429   LDLCALC 172 (H) 01/30/2023 1429    Clinical Atherosclerotic Cardiovascular Disease (ASCVD): No  The ASCVD Risk score (Arnett DK, et al., 2019) failed to calculate for the following reasons:   The 2019 ASCVD risk score is only valid for ages 33 to 58   A/P: Diabetes longstanding currently uncontrolled. Patient is able to verbalize appropriate hypoglycemia management plan. Medication adherence appears to be suboptimal based on dispense report. We cannot make changes to his insulin given his non-adherence and that he is not checking sugar levels at home.  -Continued Basaglar (insulin glargine) 66 units daily in the morning -continued Humalog (insulin lispro) 20 units three times daily before meals -Discontinued Trulicity 0.75 mg weekly because of cost. -Patient educated on purpose, proper use, and potential adverse effects of medications.  -Extensively discussed pathophysiology of diabetes, recommended lifestyle interventions, dietary effects on blood sugar control.  -Counseled on s/sx of and management of hypoglycemia.  -A1c anticipated 05/2023.   Written patient instructions provided. Patient verbalized understanding of treatment plan.  Total time in face to face counseling 30 minutes.    Follow-up:  Pharmacist 4/03/17/2023. PCP clinic visit in 05/21/2023  Patient seen with  Haywood Filler, PharmD Candidate [employer]  Class  of 2027  Butch Penny, PharmD, Patsy [place], CPP Clinical Pharmacist [facility] & [facility] [phone]

## 2023-04-24 ENCOUNTER — Encounter: Payer: Self-pay | Admitting: Pharmacist

## 2023-04-24 ENCOUNTER — Other Ambulatory Visit: Payer: Self-pay

## 2023-04-24 ENCOUNTER — Ambulatory Visit: Payer: Self-pay | Attending: Internal Medicine | Admitting: Pharmacist

## 2023-04-24 DIAGNOSIS — E1129 Type 2 diabetes mellitus with other diabetic kidney complication: Secondary | ICD-10-CM

## 2023-04-24 DIAGNOSIS — Z794 Long term (current) use of insulin: Secondary | ICD-10-CM

## 2023-04-24 DIAGNOSIS — R809 Proteinuria, unspecified: Secondary | ICD-10-CM

## 2023-04-24 MED ORDER — PEN NEEDLES 31G X 6 MM MISC
2 refills | Status: DC
  Filled 2023-04-24: qty 100, 25d supply, fill #0
  Filled 2023-05-17: qty 400, 30d supply, fill #0

## 2023-04-24 MED ORDER — BASAGLAR KWIKPEN 100 UNIT/ML ~~LOC~~ SOPN
66.0000 [IU] | PEN_INJECTOR | Freq: Every day | SUBCUTANEOUS | 6 refills | Status: DC
Start: 2023-04-24 — End: 2023-05-21
  Filled 2023-04-24: qty 18, 27d supply, fill #0
  Filled 2023-05-17: qty 15, 22d supply, fill #0

## 2023-04-24 MED ORDER — INSULIN LISPRO (1 UNIT DIAL) 100 UNIT/ML (KWIKPEN)
20.0000 [IU] | PEN_INJECTOR | Freq: Three times a day (TID) | SUBCUTANEOUS | 3 refills | Status: DC
Start: 2023-04-24 — End: 2023-07-24
  Filled 2023-04-24 – 2023-05-17 (×2): qty 15, 25d supply, fill #0

## 2023-05-04 ENCOUNTER — Other Ambulatory Visit: Payer: Self-pay

## 2023-05-17 ENCOUNTER — Other Ambulatory Visit: Payer: Self-pay

## 2023-05-21 ENCOUNTER — Other Ambulatory Visit: Payer: Self-pay

## 2023-05-21 ENCOUNTER — Ambulatory Visit: Payer: Self-pay | Attending: Internal Medicine | Admitting: Internal Medicine

## 2023-05-21 VITALS — BP 134/87 | HR 80 | Temp 97.6°F | Ht 66.0 in | Wt 188.0 lb

## 2023-05-21 DIAGNOSIS — E785 Hyperlipidemia, unspecified: Secondary | ICD-10-CM

## 2023-05-21 DIAGNOSIS — E119 Type 2 diabetes mellitus without complications: Secondary | ICD-10-CM

## 2023-05-21 DIAGNOSIS — R7989 Other specified abnormal findings of blood chemistry: Secondary | ICD-10-CM

## 2023-05-21 DIAGNOSIS — Z23 Encounter for immunization: Secondary | ICD-10-CM

## 2023-05-21 DIAGNOSIS — E1169 Type 2 diabetes mellitus with other specified complication: Secondary | ICD-10-CM

## 2023-05-21 DIAGNOSIS — Z794 Long term (current) use of insulin: Secondary | ICD-10-CM

## 2023-05-21 DIAGNOSIS — I10 Essential (primary) hypertension: Secondary | ICD-10-CM

## 2023-05-21 DIAGNOSIS — I152 Hypertension secondary to endocrine disorders: Secondary | ICD-10-CM

## 2023-05-21 DIAGNOSIS — R809 Proteinuria, unspecified: Secondary | ICD-10-CM

## 2023-05-21 LAB — GLUCOSE, POCT (MANUAL RESULT ENTRY): POC Glucose: 178 mg/dL — AB (ref 70–99)

## 2023-05-21 LAB — POCT GLYCOSYLATED HEMOGLOBIN (HGB A1C): HbA1c, POC (controlled diabetic range): 12.5 % — AB (ref 0.0–7.0)

## 2023-05-21 MED ORDER — LOSARTAN POTASSIUM 50 MG PO TABS
50.0000 mg | ORAL_TABLET | Freq: Every day | ORAL | 1 refills | Status: DC
  Filled 2023-05-21: qty 30, 30d supply, fill #0

## 2023-05-21 MED ORDER — BASAGLAR KWIKPEN 100 UNIT/ML ~~LOC~~ SOPN
72.0000 [IU] | PEN_INJECTOR | Freq: Every day | SUBCUTANEOUS | 6 refills | Status: DC
  Filled 2023-05-21: qty 18, 25d supply, fill #0

## 2023-05-21 NOTE — Progress Notes (Signed)
 Patient ID: Adam Skinner, male    DOB: 1995/05/15  MRN: [id number]  CC: Diabetes (DM f/u. /No questions / concerns/Yes to pneumonia vax)   Subjective: Adam Skinner is a 28 y.o. male who presents for chronic ds management. 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 #[id number]  DM: Results for orders placed or performed in visit on 05/21/23  POCT glucose (manual entry)   Collection Time: 05/21/23  8:45 AM  Result Value Ref Range   POC Glucose 178 (A) 70 - 99 mg/dl  POCT glycosylated hemoglobin (Hb A1C)   Collection Time: 05/21/23  8:50 AM  Result Value Ref Range   Hemoglobin A1C     HbA1c POC (<> result, manual entry)     HbA1c, POC (prediabetic range)     HbA1c, POC (controlled diabetic range) 12.5 (A) 0.0 - 7.0 %  A1C higher today than 3 mths ago. Confirms taking Humalog 20 units TID with meals and Basaglar 66 units daily in a.m -No longer has CGM; does manual check 1-2 times a day: before BF range 160-170 and after dinner 190-210. He has not eaten as yet for the a.m. Reports he started a strict diet and regular exercise 1.5 wks ago. Prior to that reports his diet was not good, lots of carbs; attributes this to A1C being so high.  No blurred vision, polyuria/polydipsia.  Will have insurance as of 06/12/23  Started on Lipitor 10 mg on last visit but we had him hold it after LFTs showed ALT 78/Alk phos 128 at base line.  Started on Cozaar 25 mg by clinical pharmacist 03/08/23 for HTN.  Taking it consistently.  Tries to limit salt    Patient Active Problem List   Diagnosis Date Noted   Type 2 diabetes mellitus with microalbuminuria, with long-term current use of insulin (HCC) 01/04/2022   Hyponatremia 08/31/2021   Bandemia 08/31/2021   Normocytic anemia 08/31/2021   Language barrier 08/31/2021    Epigastric pain    Liver abscess 08/28/2021   Uncontrolled diabetes mellitus with hyperglycemia, without long-term current use of insulin (HCC) 08/28/2021     Current Outpatient Medications on File Prior to Visit  Medication Sig Dispense Refill   blood glucose meter kit and supplies Use up to 4 times a day as directed 1 each 0   Continuous Glucose Receiver (DEXCOM G7 RECEIVER) DEVI Use to check blood glucose continuously. 1 each 0   Continuous Glucose Sensor (DEXCOM G7 SENSOR) MISC Use to check blood glucose throughout the day. Changes sensors once every 10 days. 3 each 6   glucose blood (TRUE METRIX BLOOD GLUCOSE TEST) test strip use up to 4 times a day as directed 200 each 0   insulin lispro (HUMALOG) 100 UNIT/ML KwikPen Inject 20 Units into the skin 3 (three) times daily. 15 mL 3   Insulin Pen Needle (PEN NEEDLES) 31G X 6 MM MISC Use to inject insulin a total of 4x daily. 400 each 2   TRUEplus Lancets 28G MISC use up to 4 times a day as directed 200 each 0   No current facility-administered medications on file prior to visit.    Allergies  Allergen Reactions   Pork-Derived Products     Social History   Socioeconomic History   Marital status: Married    Spouse name: Not on file  Number of children: Not on file   Years of education: Not on file   Highest education level: Not on file  Occupational History   Not on file  Tobacco Use   Smoking status: Never   Smokeless tobacco: Never  Vaping Use   Vaping status: Never Used  Substance and Sexual Activity   Alcohol use: Not Currently   Drug use: Never   Sexual activity: Not on file  Other Topics Concern   Not on file  Social History Narrative   Not on file   Social Drivers of Health   Financial Resource Strain: Medium Risk (02/20/2023)   Overall Financial Resource Strain (CARDIA)    Difficulty of Paying Living Expenses: Somewhat hard  Food Insecurity: Food Insecurity Present (02/20/2023)   Hunger Vital Sign     Worried About Running Out of Food in the Last Year: Sometimes true    Ran Out of Food in the Last Year: Sometimes true  Transportation Needs: No Transportation Needs (02/20/2023)   PRAPARE - Administrator, Civil Service (Medical): No    Lack of Transportation (Non-Medical): No  Physical Activity: Insufficiently Active (02/20/2023)   Exercise Vital Sign    Days of Exercise per Week: 2 days    Minutes of Exercise per Session: 30 min  Stress: No Stress Concern Present (02/20/2023)   [employer] of Occupational Health - Occupational Stress Questionnaire    Feeling of Stress : Not at all  Social Connections: Moderately Integrated (02/20/2023)   Social Connection and Isolation Panel [NHANES]    Frequency of Communication with Friends and Family: Twice a week    Frequency of Social Gatherings with Friends and Family: More than three times a week    Attends Religious Services: Never    Database administrator or Organizations: Yes    Attends Banker Meetings: 1 to 4 times per year    Marital Status: Married  Catering manager Violence: Not At Risk (02/20/2023)   Humiliation, Afraid, Rape, and Kick questionnaire    Fear of Current or Ex-Partner: No    Emotionally Abused: No    Physically Abused: No    Sexually Abused: No    Family History  Problem Relation Age of Onset   Diabetes Mother    Diabetes Father    Heart disease Neg Hx     Past Surgical History:  Procedure Laterality Date   IR RADIOLOGIST EVAL & MGMT  09/15/2021   IR RADIOLOGIST EVAL & MGMT  09/27/2021   LIVER SURGERY      ROS: Review of Systems Negative except as stated above  PHYSICAL EXAM: BP 134/87 (BP Location: Left Arm, Patient Position: Sitting, Cuff Size: Normal)   Pulse 80   Temp 97.6 F (36.4 C) (Oral)   Ht 5\' 6"  (1.676 m)   Wt 188 lb (85.3 kg)   SpO2 100%   BMI 30.34 kg/m   Physical Exam   General appearance - alert, well appearing, young Hispanic male and in no  distress Mental status - alert, oriented to person, place, and time Chest - clear to auscultation, no wheezes, rales or rhonchi, symmetric air entry Heart - normal rate, regular rhythm, normal S1, S2, no murmurs, rubs, clicks or gallops Extremities - peripheral pulses normal, no pedal edema, no clubbing or cyanosis     Latest Ref Rng & Units 03/20/2023    2:48 PM 02/20/2023    2:26 PM 10/19/2022    8:55 AM  CMP  Glucose 70 - 99 mg/dL 401   027   BUN 6 - 20 mg/dL 15   18   Creatinine 2.53 - 1.27 mg/dL 6.64   4.03   Sodium 474 - 144 mmol/L 140   138   Potassium 3.5 - 5.2 mmol/L 4.5   3.9   Chloride 96 - 106 mmol/L 100   100   CO2 20 - 29 mmol/L 26   25   Calcium 8.7 - 10.2 mg/dL 9.9   9.8   Total Protein 6.0 - 8.5 g/dL  8.1  7.1   Total Bilirubin 0.0 - 1.2 mg/dL  0.3  0.2   Alkaline Phos 44 - 121 IU/L  128  105   AST 0 - 40 IU/L  37  28   ALT 0 - 44 IU/L  78  34    Lipid Panel     Component Value Date/Time   CHOL 267 (H) 01/30/2023 1429   TRIG 271 (H) 01/30/2023 1429   HDL 43 01/30/2023 1429   CHOLHDL 6.2 (H) 01/30/2023 1429   LDLCALC 172 (H) 01/30/2023 1429    CBC    Component Value Date/Time   WBC 10.3 10/19/2022 0855   WBC 7.6 09/06/2021 0427   RBC 5.40 10/19/2022 0855   RBC 4.02 (L) 09/06/2021 0427   HGB 15.5 10/19/2022 0855   HCT 46.3 10/19/2022 0855   PLT 356 10/19/2022 0855   MCV 86 10/19/2022 0855   MCH 28.7 10/19/2022 0855   MCH 26.4 09/06/2021 0427   MCHC 33.5 10/19/2022 0855   MCHC 31.4 09/06/2021 0427   RDW 12.2 10/19/2022 0855   LYMPHSABS 2.5 10/03/2021 1040   MONOABS 1.2 (H) 09/01/2021 0432   EOSABS 0.0 10/03/2021 1040   BASOSABS 0.0 10/03/2021 1040    ASSESSMENT AND PLAN: 1. Type 2 diabetes mellitus with microalbuminuria, with long-term current use of insulin (HCC) (Primary) Not at goal.  He reports compliance with medications but reports dietary indiscretions up until a week and a half ago.  Will have him increase glargine insulin to 72 units  daily.  Patient states that he still has about 6 or 7 pens of the glargine.  He has an appointment with the clinical pharmacist next month.  At that time we should change him to [facility] if it would be covered by his insurance.  Continue mealtime insulin at 20 units with meals.  Dietary counseling discussed. -He will have insurance as of April 1.  I would like to get him in with an endocrinologist to assist with management of his diabetes.  Patient is agreeable to that.  Will also refer him to a nutritionist and for his diabetic eye exam. - POCT glycosylated hemoglobin (Hb A1C) - POCT glucose (manual entry) - Ambulatory referral to Endocrinology - Amb ref to Medical Nutrition Therapy-MNT - Ambulatory referral to Ophthalmology - Insulin Glargine (BASAGLAR KWIKPEN) 100 UNIT/ML; Inject 72 Units into the skin daily.  Dispense: 18 mL; Refill: 6 - losartan (COZAAR) 50 MG tablet; Take 1 tablet (50 mg total) by mouth daily.  Dispense: 90 tablet; Refill: 1 - Comprehensive metabolic panel  2. Hypertension associated with diabetes (HCC) Not at goal.  Increase Cozaar to 50 mg daily - losartan (COZAAR) 50 MG tablet; Take 1 tablet (50 mg total) by mouth daily.  Dispense: 90 tablet; Refill: 1  3. Hyperlipidemia associated with type 2 diabetes mellitus (HCC) 4. Abnormal LFTs We will recheck liver function test today.  If stable or normalized, we will have  him restart the atorvastatin.  5. Need for vaccination against Streptococcus pneumoniae Pneumonia 23 given today.  We were out of the 15.   Patient was given the opportunity to ask questions.  Patient verbalized understanding of the plan and was able to repeat key elements of the plan.   This documentation was completed using Paediatric nurse.  Any transcriptional errors are unintentional.  Orders Placed This Encounter  Procedures   Pneumococcal polysaccharide vaccine 23-valent greater than or equal to 2yo subcutaneous/IM    Comprehensive metabolic panel   Ambulatory referral to Endocrinology   Amb ref to Medical Nutrition Therapy-MNT   Ambulatory referral to Ophthalmology   POCT glycosylated hemoglobin (Hb A1C)   POCT glucose (manual entry)     Requested Prescriptions   Signed Prescriptions Disp Refills   Insulin Glargine (BASAGLAR KWIKPEN) 100 UNIT/ML 18 mL 6    Sig: Inject 72 Units into the skin daily.   losartan (COZAAR) 50 MG tablet 90 tablet 1    Sig: Take 1 tablet (50 mg total) by mouth daily.    Return in about 3 months (around 08/21/2023).  Jonah Blue, MD, FACP

## 2023-05-22 LAB — COMPREHENSIVE METABOLIC PANEL
ALT: 53 IU/L — ABNORMAL HIGH (ref 0–44)
AST: 42 IU/L — ABNORMAL HIGH (ref 0–40)
Albumin: 3.9 g/dL — ABNORMAL LOW (ref 4.3–5.2)
Alkaline Phosphatase: 135 IU/L — ABNORMAL HIGH (ref 44–121)
BUN/Creatinine Ratio: 14 (ref 9–20)
BUN: 12 mg/dL (ref 6–20)
Bilirubin Total: 0.3 mg/dL (ref 0.0–1.2)
CO2: 24 mmol/L (ref 20–29)
Calcium: 10.3 mg/dL — ABNORMAL HIGH (ref 8.7–10.2)
Chloride: 100 mmol/L (ref 96–106)
Creatinine, Ser: 0.83 mg/dL (ref 0.76–1.27)
Globulin, Total: 3.1 g/dL (ref 1.5–4.5)
Glucose: 195 mg/dL — ABNORMAL HIGH (ref 70–99)
Potassium: 4.1 mmol/L (ref 3.5–5.2)
Sodium: 139 mmol/L (ref 134–144)
Total Protein: 7 g/dL (ref 6.0–8.5)
eGFR: 123 mL/min/{1.73_m2} (ref >59)

## 2023-05-23 ENCOUNTER — Other Ambulatory Visit: Payer: Self-pay | Admitting: Internal Medicine

## 2023-05-23 DIAGNOSIS — R7989 Other specified abnormal findings of blood chemistry: Secondary | ICD-10-CM

## 2023-05-23 NOTE — Progress Notes (Signed)
 Kidney function is good.  Still has mild elevation in liver function test.  I think we can move forward with having him restart the Atorvastatin medication for cholesterol.  Instead of taking it every day, I recommend that he takes it only 3 days a week on Monday Wednesdays and Fridays.  Let me know if he still has the atorvastatin at home that was previously prescribed all with I need to send a new prescription. After being on the medication for 4 wks, he should return to the lab for recheck of liver function test.

## 2023-05-26 ENCOUNTER — Other Ambulatory Visit: Payer: Self-pay | Admitting: Internal Medicine

## 2023-05-26 MED ORDER — ATORVASTATIN CALCIUM 10 MG PO TABS
10.0000 mg | ORAL_TABLET | ORAL | 1 refills | Status: DC
  Filled 2023-05-26: qty 12, 30d supply, fill #0
  Filled 2023-07-03: qty 36, 84d supply, fill #0

## 2023-05-28 ENCOUNTER — Other Ambulatory Visit: Payer: Self-pay

## 2023-05-30 ENCOUNTER — Other Ambulatory Visit: Payer: Self-pay

## 2023-06-06 ENCOUNTER — Other Ambulatory Visit: Payer: Self-pay

## 2023-06-22 NOTE — Progress Notes (Signed)
 S:     Chief Complaint  Patient presents with   Diabetes   [address] [place] #[id number]  28 y.o. male who presents for HTN and diabetes evaluation, education, and management. PMH is significant for T2DM and Liver abscess (2023).   Patient was referred by Primary Care Provider, Dr. Lincoln Renshaw, on 02/20/2023. Mild elevation was detected in LFT. Atorvastatin was held. Patient waws seen by pharmacy team on 03/20/23. BP was 122/76 on losartan only. Trulicity 0.75 mg weekly was added for DM. Adherence for insulin was encouraged. At follow-up with pharmacy on 04/24/23, patient reported that Trulicity was cost-prohibitive. He endorsed adherence to insulin by there are discrepancies in his fill history for Humalog and Basaglar. Discontinued Trulicity due to cost and continued current insulin regimen, with emphasis on adherence and monitoring BG at home.   At PCP appointment on 05/21/23, atorvastatin was restarted at 10 mg on MWF. Patient reported elevated fasting and PPG, so Basaglar was increased to 72 units daily. BP was also elevated, so losartan was increased to 50 mg daily.   Today, patient reports that he is doing well. He reports that he ran out of Basaglar yesterday, so he plans to fill this at [facility] pharmacy before leaving today. States that he rarely missed doses, despite last fill on 05/17/23 for 22 day supply. He states he has Humalog pens left. States that he takes 20 units with meals, but often forgets his lunch time dose. He brings Humalog with him to work, but leaves it in his car. He has new insurance as of 06/12/23. He has not started taking atorvastatin yet. He reports that since his last A1C was elevated to 12.5%, he has made changes to his diet (cut out juices, sweets) and increased physical activity.    Family/Social History:  -Fhx: none reported -Tobacco: never smoker  -Alcohol: "not consistently"   Current diabetes medications include: Basaglar (insulin glargine) 72  units daily in the morning,  Humalog (insulin lispro) 20 units three times daily before meals Current HTN medications include: losartan 50 mg daily  Current HLD medications: atorvastatin MWF (has not picked up yet)  Insurance coverage: [employer] - Commercial Plan  Patient endorses hypoglycemic events. Reports symptoms of sweating, shakiness ~1 time in the past two weeks. He did not check his blood sugar at this time.  Reported home fasting blood sugars: 180-210 mg/dL (0:45WU, 9AM) via photos of his glucometer  Patient denies polyphagia, polydipsia. Patient denies nocturia (nighttime urination).  Patient denies neuropathy (nerve pain). Patient denies visual changes.  Patient reported dietary habits: Eats 2 meals/day - cut back on juices, sweets Breakfast: eggs, vegetables, smoothie Dinner: vegetables, salmon, fish, meat Snacks: fruits, seeds, nuts Drinks: water, sparkling water   Patient-reported exercise habits: has increased exercise - running, biking three times/week (45-60 min)  O:  Lab Results  Component Value Date   HGBA1C 12.5 (A) 05/21/2023   BP Readings from Last 3 Encounters:  05/21/23 134/87  03/20/23 122/76  03/08/23 135/86    BMET    Component Value Date/Time   NA 139 05/21/2023 0943   K 4.1 05/21/2023 0943   CL 100 05/21/2023 0943   CO2 24 05/21/2023 0943   GLUCOSE 195 (H) 05/21/2023 0943   GLUCOSE 172 (H) 09/06/2021 0427   BUN 12 05/21/2023 0943   CREATININE 0.83 05/21/2023 0943   CALCIUM 10.3 (H) 05/21/2023 0943   EGFR 123 05/21/2023 0943   GFRNONAA >60 09/06/2021 0427     Lipid  panel     Component Value Date/Time   CHOL 267 (H) 01/30/2023 1429   TRIG 271 (H) 01/30/2023 1429   HDL 43 01/30/2023 1429   CHOLHDL 6.2 (H) 01/30/2023 1429   LDLCALC 172 (H) 01/30/2023 1429    Clinical Atherosclerotic Cardiovascular Disease (ASCVD): No  The ASCVD Risk score (Arnett DK, et al., 2019) failed to calculate for the following reasons:   The  2019 ASCVD risk score is only valid for ages 18 to 35   A/P: Diabetes longstanding currently uncontrolled with last A1c of 12.5% above goal < 7% and worsened from 10.7%. Patient is able to verbalize appropriate hypoglycemia management plan. Medication adherence appears to be suboptimal based on dispense report. Trulicity was previously cost-prohibitive, but patient has new insurance so will resubmit PA today. Will also investigate coverage of Toujeo U300 Max Solostar to prolong day supply of each insulin pen and Dexcom G7 for improved BG monitoring. If GLP-1RA remains cost-prohibitive, will evaluate need for increase in insulin, though suspect that adherence continues to be a barrier to glycemic control.  -Continued Basaglar (insulin glargine) 72 units daily in the morning. Will investigate coverage of Toujeo U300 Max Solostar pen. -continued Humalog (insulin lispro) 20 units three times daily before meals. Emphasized adherence and advised against keeping insulin in the car due to fluctuating temperatures. -Start Trulicity 0.75 mg weekly if affordable. -Patient educated on purpose, proper use, and potential adverse effects of medications.  -Extensively discussed pathophysiology of diabetes, recommended lifestyle interventions, dietary effects on blood sugar control.  -Counseled on s/sx of and management of hypoglycemia.  -Sent Rx for DexcomG7 for continuous glucose monitoring. Will collaborate with patient advocate team to determine coverage.  -A1c anticipated 08/2023.   Written patient instructions provided. Patient verbalized understanding of treatment plan.  Total time in face to face counseling 30 minutes.    Follow-up:  Pharmacist 07/24/23 PCP clinic visit in 08/27/23 Arthea Larsson, PharmD PGY1 Pharmacy Resident

## 2023-06-25 ENCOUNTER — Ambulatory Visit: Payer: Self-pay | Attending: Internal Medicine | Admitting: Pharmacist

## 2023-06-25 ENCOUNTER — Ambulatory Visit: Payer: Self-pay

## 2023-06-25 ENCOUNTER — Encounter: Payer: Self-pay | Admitting: Pharmacist

## 2023-06-25 ENCOUNTER — Other Ambulatory Visit (HOSPITAL_COMMUNITY): Payer: Self-pay

## 2023-06-25 ENCOUNTER — Other Ambulatory Visit: Payer: Self-pay

## 2023-06-25 VITALS — Wt 189.0 lb

## 2023-06-25 DIAGNOSIS — R7989 Other specified abnormal findings of blood chemistry: Secondary | ICD-10-CM

## 2023-06-25 DIAGNOSIS — E1129 Type 2 diabetes mellitus with other diabetic kidney complication: Secondary | ICD-10-CM | POA: Diagnosis not present

## 2023-06-25 DIAGNOSIS — R809 Proteinuria, unspecified: Secondary | ICD-10-CM

## 2023-06-25 DIAGNOSIS — Z794 Long term (current) use of insulin: Secondary | ICD-10-CM

## 2023-06-25 MED ORDER — TRULICITY 0.75 MG/0.5ML ~~LOC~~ SOAJ
0.7500 mg | SUBCUTANEOUS | 3 refills | Status: DC
  Filled 2023-06-25 – 2023-06-26 (×2): qty 2, 28d supply, fill #0

## 2023-06-25 MED ORDER — TOUJEO MAX SOLOSTAR 300 UNIT/ML ~~LOC~~ SOPN
74.0000 [IU] | PEN_INJECTOR | Freq: Every day | SUBCUTANEOUS | 11 refills | Status: DC
  Filled 2023-06-25: qty 6, 24d supply, fill #0

## 2023-06-25 MED ORDER — DEXCOM G7 SENSOR MISC
11 refills | Status: DC
  Filled 2023-06-25: qty 3, fill #0
  Filled 2023-06-26: qty 3, 30d supply, fill #0
  Filled 2023-07-27 – 2023-07-31 (×2): qty 3, 30d supply, fill #1
  Filled 2023-08-24: qty 3, 30d supply, fill #2
  Filled 2023-09-27: qty 3, 30d supply, fill #3
  Filled 2023-10-29: qty 3, 30d supply, fill #4

## 2023-06-25 NOTE — Patient Instructions (Addendum)
 It was great to see you today!  The pharmacy will call to let you know if you are approved for Trulicity and the continuous glucose sensors.   _____________________________________________  Qu bueno verte hoy!  La farmacia te llamar para informarte si tu tratamiento con Trulicity y los sensores continuos de glucosa est aprobado.  Contina con la insulina segn lo prescrito. Haz todo lo posible por no omitir ninguna dosis.

## 2023-06-26 ENCOUNTER — Telehealth: Payer: Self-pay

## 2023-06-26 ENCOUNTER — Other Ambulatory Visit: Payer: Self-pay

## 2023-06-26 LAB — HEPATIC FUNCTION PANEL
ALT: 46 IU/L — ABNORMAL HIGH (ref 0–44)
AST: 30 IU/L (ref 0–40)
Albumin: 4.4 g/dL (ref 4.3–5.2)
Alkaline Phosphatase: 144 IU/L — ABNORMAL HIGH (ref 44–121)
Bilirubin Total: 0.3 mg/dL (ref 0.0–1.2)
Bilirubin, Direct: 0.15 mg/dL (ref 0.00–0.40)
Total Protein: 7.3 g/dL (ref 6.0–8.5)

## 2023-06-26 NOTE — Telephone Encounter (Signed)
 Pharmacy Patient Advocate Encounter  Received notification from [facility] that Prior Authorization for TRULICITY has been APPROVED from 06/26/2023 to 06/25/2024   PA #/Case ID/Reference #: [id number]  Pharmacy Patient Advocate Encounter  Received notification from [facility] Medicaid that Prior Authorization for [facility] G7 SENSORS AND RECEIVER has been APPROVED from 06/26/2023 to 06/25/2024   PA #/Case ID/Reference #: [id number]

## 2023-06-27 ENCOUNTER — Other Ambulatory Visit: Payer: Self-pay | Admitting: Pharmacist

## 2023-06-27 ENCOUNTER — Telehealth: Payer: Self-pay | Admitting: Pharmacist

## 2023-06-27 ENCOUNTER — Other Ambulatory Visit: Payer: Self-pay

## 2023-06-27 MED ORDER — INSULIN GLARGINE-YFGN 100 UNIT/ML ~~LOC~~ SOPN
74.0000 [IU] | PEN_INJECTOR | Freq: Every day | SUBCUTANEOUS | 1 refills | Status: DC
  Filled 2023-06-27: qty 60, 81d supply, fill #0

## 2023-06-27 NOTE — Telephone Encounter (Signed)
-----   Message from Vivienne Grove sent at 06/26/2023  4:34 PM EDT ----- Rea [place] PA has been denied. His insurance wants trial and failure of semglee yfgn or rezvoglar?

## 2023-06-27 NOTE — Telephone Encounter (Signed)
 Received notification from patient's pharmacy that his insurance requires trial and failure of Semglee before they will cover Toujeo. Of note, we were trying to get Toujeo approval just for a better absorption (less volume injected) and a longer day supply. Unfortunately, we will have to try [facility] for now. I sent this rxn in for the patient. If his A1c remains above goal at follow-up, we will pursue PA approval for Toujeo.

## 2023-06-28 ENCOUNTER — Other Ambulatory Visit (HOSPITAL_COMMUNITY): Payer: Self-pay

## 2023-06-28 ENCOUNTER — Other Ambulatory Visit: Payer: Self-pay

## 2023-07-02 ENCOUNTER — Other Ambulatory Visit (HOSPITAL_COMMUNITY): Payer: Self-pay

## 2023-07-02 ENCOUNTER — Other Ambulatory Visit: Payer: Self-pay

## 2023-07-02 ENCOUNTER — Telehealth: Payer: Self-pay

## 2023-07-02 NOTE — Telephone Encounter (Signed)
 Called patient to let him know that Trulicity  ($15/mo), Dexcom G7 sensors ($15/mo) andinsulin  glargine (Semglee ) ($0/mo) were approved by insurance and ready for pickup. Patient reports that he will pickup tomorrow afternoon.   Patient confirms that he can use his mobile device as the Dexcom G7 reader. Patient asks if atorvastatin  is also ready for him to pickup and he was informed that he will need to ask the pharmacy to get this ready again, since it was put back.   Arthea Larsson, PharmD PGY1 Pharmacy Resident

## 2023-07-03 ENCOUNTER — Other Ambulatory Visit: Payer: Self-pay

## 2023-07-24 ENCOUNTER — Ambulatory Visit: Attending: Internal Medicine | Admitting: Pharmacist

## 2023-07-24 ENCOUNTER — Encounter: Payer: Self-pay | Admitting: Pharmacist

## 2023-07-24 ENCOUNTER — Other Ambulatory Visit: Payer: Self-pay

## 2023-07-24 DIAGNOSIS — Z794 Long term (current) use of insulin: Secondary | ICD-10-CM

## 2023-07-24 DIAGNOSIS — R809 Proteinuria, unspecified: Secondary | ICD-10-CM

## 2023-07-24 DIAGNOSIS — E1165 Type 2 diabetes mellitus with hyperglycemia: Secondary | ICD-10-CM | POA: Diagnosis not present

## 2023-07-24 DIAGNOSIS — E1129 Type 2 diabetes mellitus with other diabetic kidney complication: Secondary | ICD-10-CM | POA: Diagnosis not present

## 2023-07-24 MED ORDER — TRULICITY 1.5 MG/0.5ML ~~LOC~~ SOAJ
1.5000 mg | SUBCUTANEOUS | 1 refills | Status: DC
Start: 2023-07-24 — End: 2023-11-29
  Filled 2023-07-24: qty 2, 28d supply, fill #0
  Filled 2023-08-24: qty 2, 28d supply, fill #1
  Filled 2023-09-27: qty 2, 28d supply, fill #2
  Filled 2023-10-29: qty 2, 28d supply, fill #3

## 2023-07-24 MED ORDER — INSULIN LISPRO (1 UNIT DIAL) 100 UNIT/ML (KWIKPEN)
16.0000 [IU] | PEN_INJECTOR | Freq: Three times a day (TID) | SUBCUTANEOUS | 3 refills | Status: AC
Start: 2023-07-24 — End: ?
  Filled 2023-07-24: qty 15, 31d supply, fill #0

## 2023-07-24 NOTE — Progress Notes (Signed)
 S:     No chief complaint on file.  [address] [place] #[id number]  28 y.o. male who presents for HTN and diabetes evaluation, education, and management. PMH is significant for T2DM and Liver abscess (2023).   Patient was referred by Primary Care Provider, Dr. Lincoln Renshaw, on 05/21/2023. At follow-up with pharmacy on 06/25/2023, patient reported that he now has new insurance. We did a test claim for his Trulicity  and found that his copay was only $25 for 1 month. This was affordable for him, so we started this and got him access to G7 supplies. He endorsed adherence to insulin  at the time.    Today, patient reports that he is doing well. He is adherent to Basaglar , Humalog , and Trulicity . No issues with cost. States that he rarely missed doses and brings in his Clarity app to review. Denies any NV, abdominal pain. Did have 1 episode of emesis when he first started the Trulicity  but nothing since. No changes in vision.   Family/Social History:  -Fhx: none reported -Tobacco: never smoker  -Alcohol: "not consistently"   Current diabetes medications include: Basaglar  (insulin  glargine) 72 units daily in the morning,  Humalog  (insulin  lispro) 20 units three times daily before meals, Trulicity  0.75 mg weekly Current HTN medications include: losartan  50 mg daily  Current HLD medications: atorvastatin  10 mg MWF   Insurance coverage: [employer] - Commercial Plan  Patient endorses hypoglycemic events. Reports symptoms of sweating, shakiness ~1 time in the past two weeks. Has some readings in the 70s since last visit.   Patient denies polyphagia, polydipsia. Patient denies nocturia (nighttime urination).  Patient denies neuropathy (nerve pain). Patient denies visual changes.  Patient reported dietary habits: Eats 2 meals/day - cut back on juices, sweets Breakfast: eggs, vegetables, smoothie Dinner: vegetables, salmon, fish, meat Snacks: fruits, seeds, nuts Drinks:  water, sparkling water   Patient-reported exercise habits: has increased exercise - running, biking three times/week (45-60 min)  O:  Date of Download: 07/24/2023, 30-day Average Glucose: 172 mg/dL Glucose Management Indicator: 7.4  Time in Goal:  - Time in range 70-180: 62% - Time above range: 24% - Time below range: 1%  Lab Results  Component Value Date   HGBA1C 12.5 (A) 05/21/2023   BP Readings from Last 3 Encounters:  05/21/23 134/87  03/20/23 122/76  03/08/23 135/86    BMET    Component Value Date/Time   NA 139 05/21/2023 0943   K 4.1 05/21/2023 0943   CL 100 05/21/2023 0943   CO2 24 05/21/2023 0943   GLUCOSE 195 (H) 05/21/2023 0943   GLUCOSE 172 (H) 09/06/2021 0427   BUN 12 05/21/2023 0943   CREATININE 0.83 05/21/2023 0943   CALCIUM  10.3 (H) 05/21/2023 0943   EGFR 123 05/21/2023 0943   GFRNONAA >60 09/06/2021 0427     Lipid panel     Component Value Date/Time   CHOL 267 (H) 01/30/2023 1429   TRIG 271 (H) 01/30/2023 1429   HDL 43 01/30/2023 1429   CHOLHDL 6.2 (H) 01/30/2023 1429   LDLCALC 172 (H) 01/30/2023 1429    Clinical Atherosclerotic Cardiovascular Disease (ASCVD): No  The ASCVD Risk score (Arnett DK, et al., 2019) failed to calculate for the following reasons:   The 2019 ASCVD risk score is only valid for ages 62 to 47   A/P: Diabetes longstanding currently uncontrolled with last A1c of 12.5% above goal < 7% and worsened from 10.7%. He is now doing much better with Trulicity  and  Dexcom. He is not currently hypoglycemic but does have some readings in the 70s with associated symptoms over the past 30 days. Patient is able to verbalize appropriate hypoglycemia management plan. Medication adherence appears to be optimal. We will increase Trulicity  and decrease prandial insulin  given his improvement -Continued Basaglar  (insulin  glargine) 72 units daily in the morning.  -Decreased Humalog  (insulin  lispro) to 16 units three times daily before meals.   -Increase Trulicity  to 1.5 mg weekly. -Patient educated on purpose, proper use, and potential adverse effects of medications.  -Extensively discussed pathophysiology of diabetes, recommended lifestyle interventions, dietary effects on blood sugar control.  -Counseled on s/sx of and management of hypoglycemia.  -A1c anticipated 08/2023.   Written patient instructions provided. Patient verbalized understanding of treatment plan.  Total time in face to face counseling 30 minutes.    Follow-up:  PCP clinic visit in 08/27/23  Marene Shape, PharmD, BCACP, CPP Clinical Pharmacist [facility] [phone]

## 2023-07-25 ENCOUNTER — Other Ambulatory Visit: Payer: Self-pay

## 2023-07-27 ENCOUNTER — Other Ambulatory Visit: Payer: Self-pay

## 2023-08-01 ENCOUNTER — Other Ambulatory Visit: Payer: Self-pay

## 2023-08-02 ENCOUNTER — Other Ambulatory Visit: Payer: Self-pay

## 2023-08-03 ENCOUNTER — Other Ambulatory Visit: Payer: Self-pay

## 2023-08-23 NOTE — Progress Notes (Signed)
 S:     No chief complaint on file.  [employer] [place] #[id number]  28 y.o. male who presents for HTN and diabetes evaluation, education, and management. PMH is significant for T2DM and Liver abscess (2023).   Patient was referred by Primary Care Provider, Dr. Lincoln Renshaw, on 05/21/2023. At follow-up with pharmacy on 06/25/2023, patient reported that he now has new insurance. We did a test claim for his Trulicity  and found that his copay was only $25 for 1 month. This was affordable for him, so we started this and got him access to G7 supplies. He endorsed adherence to insulin  at the time. At last visit with pharmacy, Trulicity  was increased and Humalog  was decreased.  Today, patient reports that he is doing well. He is adherent to Semglee , Humalog , and Trulicity . No issues with cost. States that he rarely missed doses and brings in his Clarity app to review. Does endorse some nausea with increased dose of Trulicity  mostly the day after he takes his injection. Denies any vomiting or abdominal pain. Is eating less with increased Trulicity  dose. No changes in vision.   Family/Social History:  -Fhx: none reported -Tobacco: never smoker  -Alcohol: not consistently   Current diabetes medications include: Semglee  (insulin  glargine) 74 units daily in the morning,  Humalog  (insulin  lispro) 16 units three times daily before meals, Trulicity  1.5 mg weekly Current HTN medications include: losartan  50 mg daily  Current HLD medications: atorvastatin  10 mg MWF   Insurance coverage: [employer] - Commercial Plan  Patient endorses hypoglycemic events. Having BG < 70 mg/dL 8-4X per week.   Patient denies polyphagia, polydipsia.  Patient denies nocturia (nighttime urination).  Patient denies neuropathy (nerve pain). Patient denies visual changes.  Patient reported dietary habits: Eats 2 meals/day - cut back on juices, sweets Breakfast: eggs, vegetables, smoothie Dinner: vegetables, salmon, fish,  meat Snacks: fruits, seeds, nuts Drinks: water, sparkling water   Patient-reported exercise habits: has increased exercise - running, biking three times/week (45-60 min)  O: Date of Download: 08/24/2023, 30-day Average Glucose: 139 mg/dL Glucose Management Indicator: 6.6  Time in Goal:  - Time in range 70-180: 81% - Time above range: 17% - Time below range: 1%  Lab Results  Component Value Date   HGBA1C 7.8 (A) 08/24/2023   BP Readings from Last 3 Encounters:  05/21/23 134/87  03/20/23 122/76  03/08/23 135/86    BMET    Component Value Date/Time   NA 139 05/21/2023 0943   K 4.1 05/21/2023 0943   CL 100 05/21/2023 0943   CO2 24 05/21/2023 0943   GLUCOSE 195 (H) 05/21/2023 0943   GLUCOSE 172 (H) 09/06/2021 0427   BUN 12 05/21/2023 0943   CREATININE 0.83 05/21/2023 0943   CALCIUM  10.3 (H) 05/21/2023 0943   EGFR 123 05/21/2023 0943   GFRNONAA >60 09/06/2021 0427     Lipid panel     Component Value Date/Time   CHOL 267 (H) 01/30/2023 1429   TRIG 271 (H) 01/30/2023 1429   HDL 43 01/30/2023 1429   CHOLHDL 6.2 (H) 01/30/2023 1429   LDLCALC 172 (H) 01/30/2023 1429    Clinical Atherosclerotic Cardiovascular Disease (ASCVD): No  The ASCVD Risk score (Arnett DK, et al., 2019) failed to calculate for the following reasons:   The 2019 ASCVD risk score is only valid for ages 82 to 64   A/P: Diabetes longstanding currently uncontrolled with last A1c of 7.8% above goal < 7% and much improved from last A1c of > 12%.  He is now doing much better with Trulicity  and Dexcom. He is not currently hypoglycemic but does have some readings in the 70s with associated symptoms over the past 30 days. Patient is able to verbalize appropriate hypoglycemia management plan. Medication adherence appears to be optimal.  We will maintain Trulicity  dose and decrease prandial insulin  given his improvement.  -Decrease Semglee  (insulin  glargine) 68 units daily in the morning.  -Decreased Humalog   (insulin  lispro) to 14 units three times daily before meals.  -Continue Trulicity  1.5 mg weekly.  -Patient educated on purpose, proper use, and potential adverse effects of medications.  -Extensively discussed pathophysiology of diabetes, recommended lifestyle interventions, dietary effects on blood sugar control.  -Counseled on s/sx of and management of hypoglycemia.  -A1c anticipated 11/2023.  Written patient instructions provided. Patient verbalized understanding of treatment plan.  Total time in face to face counseling 30 minutes.    Follow-up:  PCP clinic visit: 08/27/23 Pharmacy: 1 month  Juleen Oakland, PharmD PGY1 Pharmacy Resident

## 2023-08-24 ENCOUNTER — Encounter: Payer: Self-pay | Admitting: Pharmacist

## 2023-08-24 ENCOUNTER — Ambulatory Visit: Payer: Self-pay | Attending: Internal Medicine | Admitting: Pharmacist

## 2023-08-24 ENCOUNTER — Other Ambulatory Visit: Payer: Self-pay

## 2023-08-24 DIAGNOSIS — Z794 Long term (current) use of insulin: Secondary | ICD-10-CM | POA: Diagnosis not present

## 2023-08-24 DIAGNOSIS — R809 Proteinuria, unspecified: Secondary | ICD-10-CM

## 2023-08-24 DIAGNOSIS — E1129 Type 2 diabetes mellitus with other diabetic kidney complication: Secondary | ICD-10-CM | POA: Diagnosis not present

## 2023-08-24 LAB — POCT GLYCOSYLATED HEMOGLOBIN (HGB A1C): HbA1c, POC (controlled diabetic range): 7.8 % — AB (ref 0.0–7.0)

## 2023-08-24 MED ORDER — INSULIN GLARGINE-YFGN 100 UNIT/ML ~~LOC~~ SOPN
68.0000 [IU] | PEN_INJECTOR | Freq: Every day | SUBCUTANEOUS | 1 refills | Status: DC
  Filled 2023-08-24 – 2023-09-27 (×2): qty 60, 88d supply, fill #0

## 2023-08-24 MED ORDER — INSULIN LISPRO (1 UNIT DIAL) 100 UNIT/ML (KWIKPEN)
14.0000 [IU] | PEN_INJECTOR | Freq: Three times a day (TID) | SUBCUTANEOUS | 3 refills | Status: DC
  Filled 2023-08-24: qty 15, 36d supply, fill #0

## 2023-08-27 ENCOUNTER — Other Ambulatory Visit: Payer: Self-pay

## 2023-08-27 ENCOUNTER — Ambulatory Visit: Payer: Self-pay | Attending: Internal Medicine | Admitting: Internal Medicine

## 2023-08-27 ENCOUNTER — Encounter: Payer: Self-pay | Admitting: Internal Medicine

## 2023-08-27 VITALS — BP 118/77 | HR 99 | Temp 98.3°F | Ht 66.0 in | Wt 192.0 lb

## 2023-08-27 DIAGNOSIS — E1129 Type 2 diabetes mellitus with other diabetic kidney complication: Secondary | ICD-10-CM | POA: Diagnosis not present

## 2023-08-27 DIAGNOSIS — Z794 Long term (current) use of insulin: Secondary | ICD-10-CM | POA: Diagnosis not present

## 2023-08-27 DIAGNOSIS — I10 Essential (primary) hypertension: Secondary | ICD-10-CM

## 2023-08-27 DIAGNOSIS — E1159 Type 2 diabetes mellitus with other circulatory complications: Secondary | ICD-10-CM

## 2023-08-27 DIAGNOSIS — E785 Hyperlipidemia, unspecified: Secondary | ICD-10-CM

## 2023-08-27 DIAGNOSIS — Z7985 Long-term (current) use of injectable non-insulin antidiabetic drugs: Secondary | ICD-10-CM

## 2023-08-27 DIAGNOSIS — E1169 Type 2 diabetes mellitus with other specified complication: Secondary | ICD-10-CM

## 2023-08-27 DIAGNOSIS — E08319 Diabetes mellitus due to underlying condition with unspecified diabetic retinopathy without macular edema: Secondary | ICD-10-CM

## 2023-08-27 MED ORDER — LOSARTAN POTASSIUM 25 MG PO TABS
25.0000 mg | ORAL_TABLET | Freq: Every day | ORAL | 5 refills | Status: DC
  Filled 2023-08-27: qty 30, 30d supply, fill #0

## 2023-08-27 NOTE — Progress Notes (Signed)
 Patient ID: Adam Skinner, male    DOB: 08-29-1995  MRN: [id number]  CC: Diabetes (DM f/u. [facility] not taking Losartan  due to pharmacy not dispensing /Yes to HPV vax)   Subjective: Adam Skinner is a 28 y.o. male who presents for chronic ds management. 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 #[id number]  Discussed the use of AI scribe software for clinical note transcription with the patient, who gave verbal consent to proceed.  History of Present Illness Adam Skinner is a 28 year old male with diabetes who presents for follow-up of his diabetes management.  He has improved his A1c from 12.5% to 7.8% with his current regimen of Humalog  insulin  at 14 units with meals, a long-acting insulin  Semglee  68 units daily, and Trulicity  1.5 mg weekly. His continuous glucose monitor shows a target range 80-81% of the time over the past two weeks without hypoglycemic episodes. He has significantly reduced his intake of flour products, carbohydrates, and sugars by 75-100% and exercises by walking or jogging 2-3 times a week for 30-45 minutes. He continues atorvastatin  10 mg three times a week for cholesterol management. He has not had an eye exam for diabetic retinopathy, though an abnormality was noted during a previous eye test for glasses. He also has history of HTN and microalbuminuria.  Supposed to be on Cozaar  50 mg daily.  However patient states he has been out of it for quite some time.  Last prescription was sent in March and looks like he never picked it up.     Patient Active Problem List   Diagnosis Date Noted   Type 2 diabetes mellitus with microalbuminuria, with long-term current use of insulin  (HCC) 01/04/2022   Hyponatremia 08/31/2021   Bandemia 08/31/2021   Normocytic anemia 08/31/2021   Language  barrier 08/31/2021   Epigastric pain    Liver abscess 08/28/2021   Uncontrolled diabetes mellitus with hyperglycemia, without long-term current use of insulin  (HCC) 08/28/2021     Current Outpatient Medications on File Prior to Visit  Medication Sig Dispense Refill   atorvastatin  (LIPITOR) 10 MG tablet Take 1 tablet (10 mg total) by mouth every Monday, Wednesday, and Friday. 36 tablet 1   blood glucose meter kit and supplies Use up to 4 times a day as directed 1 each 0   Continuous Glucose Receiver (DEXCOM G7 RECEIVER) DEVI Use to check blood glucose continuously. 1 each 0   Continuous Glucose Sensor (DEXCOM G7 SENSOR) MISC Use to check blood glucose throughout the day. Changes sensors once every 10 days. 3 each 11   Dulaglutide  (TRULICITY ) 1.5 MG/0.5ML SOAJ Inject 1.5 mg into the skin once a week. 6 mL 1   glucose blood (TRUE METRIX BLOOD GLUCOSE TEST) test strip use up to 4 times a day as directed 200 each 0   insulin  glargine-yfgn (SEMGLEE ) 100 UNIT/ML Pen Inject 68 Units into the skin daily. 60 mL 1   insulin  lispro (HUMALOG ) 100 UNIT/ML KwikPen Inject 14 Units into the skin 3 (three) times daily. 15 mL 3   Insulin  Pen Needle (PEN NEEDLES) 31G X 6 MM MISC Use to inject insulin  a total of 4x daily. 400 each 2   TRUEplus Lancets 28G MISC use up to 4 times a day as directed 200 each 0   No current facility-administered medications on file prior to visit.  Allergies  Allergen Reactions   Pork-Derived Products     Social History   Socioeconomic History   Marital status: Married    Spouse name: Not on file   Number of children: Not on file   Years of education: Not on file   Highest education level: Not on file  Occupational History   Not on file  Tobacco Use   Smoking status: Never   Smokeless tobacco: Never  Vaping Use   Vaping status: Never Used  Substance and Sexual Activity   Alcohol use: Not Currently   Drug use: Never   Sexual activity: Not on file  Other Topics  Concern   Not on file  Social History Narrative   Not on file   Social Drivers of Health   Financial Resource Strain: Medium Risk (02/20/2023)   Overall Financial Resource Strain (CARDIA)    Difficulty of Paying Living Expenses: Somewhat hard  Food Insecurity: Food Insecurity Present (02/20/2023)   Hunger Vital Sign    Worried About Running Out of Food in the Last Year: Sometimes true    Ran Out of Food in the Last Year: Sometimes true  Transportation Needs: No Transportation Needs (02/20/2023)   PRAPARE - Administrator, Civil Service (Medical): No    Lack of Transportation (Non-Medical): No  Physical Activity: Insufficiently Active (02/20/2023)   Exercise Vital Sign    Days of Exercise per Week: 2 days    Minutes of Exercise per Session: 30 min  Stress: No Stress Concern Present (02/20/2023)   [employer] of Occupational Health - Occupational Stress Questionnaire    Feeling of Stress : Not at all  Social Connections: Moderately Integrated (02/20/2023)   Social Connection and Isolation Panel    Frequency of Communication with Friends and Family: Twice a week    Frequency of Social Gatherings with Friends and Family: More than three times a week    Attends Religious Services: Never    Database administrator or Organizations: Yes    Attends Banker Meetings: 1 to 4 times per year    Marital Status: Married  Catering manager Violence: Not At Risk (02/20/2023)   Humiliation, Afraid, Rape, and Kick questionnaire    Fear of Current or Ex-Partner: No    Emotionally Abused: No    Physically Abused: No    Sexually Abused: No    Family History  Problem Relation Age of Onset   Diabetes Mother    Diabetes Father    Heart disease Neg Hx     Past Surgical History:  Procedure Laterality Date   IR RADIOLOGIST EVAL & MGMT  09/15/2021   IR RADIOLOGIST EVAL & MGMT  09/27/2021   LIVER SURGERY      ROS: Review of Systems Negative except as stated  above  PHYSICAL EXAM: BP 118/77 (BP Location: Left Arm, Patient Position: Sitting, Cuff Size: Normal)   Pulse 99   Temp 98.3 F (36.8 C) (Oral)   Ht 5' 6 (1.676 m)   Wt 192 lb (87.1 kg)   SpO2 99%   BMI 30.99 kg/m   Wt Readings from Last 3 Encounters:  08/27/23 192 lb (87.1 kg)  06/25/23 189 lb (85.7 kg)  05/21/23 188 lb (85.3 kg)  Repeat BP 128/82  Physical Exam   General appearance - alert, well appearing, and in no distress Mental status - normal mood, behavior, speech, dress, motor activity, and thought processes Chest - clear to auscultation, no wheezes, rales or  rhonchi, symmetric air entry Heart - normal rate, regular rhythm, normal S1, S2, no murmurs, rubs, clicks or gallops Extremities - peripheral pulses normal, no pedal edema, no clubbing or cyanosis     Latest Ref Rng & Units 06/25/2023    2:46 PM 05/21/2023    9:43 AM 03/20/2023    2:48 PM  CMP  Glucose 70 - 99 mg/dL  161  096   BUN 6 - 20 mg/dL  12  15   Creatinine 0.45 - 1.27 mg/dL  4.09  8.11   Sodium 914 - 144 mmol/L  139  140   Potassium 3.5 - 5.2 mmol/L  4.1  4.5   Chloride 96 - 106 mmol/L  100  100   CO2 20 - 29 mmol/L  24  26   Calcium  8.7 - 10.2 mg/dL  78.2  9.9   Total Protein 6.0 - 8.5 g/dL 7.3  7.0    Total Bilirubin 0.0 - 1.2 mg/dL 0.3  0.3    Alkaline Phos 44 - 121 IU/L 144  135    AST 0 - 40 IU/L 30  42    ALT 0 - 44 IU/L 46  53     Lipid Panel     Component Value Date/Time   CHOL 267 (H) 01/30/2023 1429   TRIG 271 (H) 01/30/2023 1429   HDL 43 01/30/2023 1429   CHOLHDL 6.2 (H) 01/30/2023 1429   LDLCALC 172 (H) 01/30/2023 1429    CBC    Component Value Date/Time   WBC 10.3 10/19/2022 0855   WBC 7.6 09/06/2021 0427   RBC 5.40 10/19/2022 0855   RBC 4.02 (L) 09/06/2021 0427   HGB 15.5 10/19/2022 0855   HCT 46.3 10/19/2022 0855   PLT 356 10/19/2022 0855   MCV 86 10/19/2022 0855   MCH 28.7 10/19/2022 0855   MCH 26.4 09/06/2021 0427   MCHC 33.5 10/19/2022 0855   MCHC 31.4  09/06/2021 0427   RDW 12.2 10/19/2022 0855   LYMPHSABS 2.5 10/03/2021 1040   MONOABS 1.2 (H) 09/01/2021 0432   EOSABS 0.0 10/03/2021 1040   BASOSABS 0.0 10/03/2021 1040    ASSESSMENT AND PLAN: 1. Type 2 diabetes mellitus with microalbuminuria, with long-term current use of insulin  (HCC) (Primary) Closer to goal.  Continue Humalog  14 units with meals, Trulicity  1.5 mg once a week and Semglee  68 units daily continue healthy eating habits and regular exercise.  I recommend restarting Cozaar  at a lower dose of 25 mg to cover for the microalbuminuria.  Advised to pick it up from the pharmacy today. - losartan  (COZAAR ) 25 MG tablet; Take 1 tablet (25 mg total) by mouth daily.  Dispense: 30 tablet; Refill: 5  2. 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 Long-term (current) use of injectable non-insulin  antidiabetic drugs See #1 above.  4. Hyperlipidemia associated with type 2 diabetes mellitus (HCC) Continue atorvastatin  10 mg 3 times a week.  5. Hypertension associated with diabetes (HCC) Blood pressure today was good.  However we restarted low-dose Cozaar  also for the microalbuminuria. - losartan  (COZAAR ) 25 MG tablet; Take 1 tablet (25 mg total) by mouth daily.  Dispense: 30 tablet; Refill: 5  We talked about HPV vaccine.  Current recommendation was that it should not be offered after the age of 20 because most people may have been exposed at that time anyway.  Patient was given  the opportunity to ask questions.  Patient verbalized understanding of the plan and was able to repeat key elements of the plan.   This documentation was completed using Paediatric nurse.  Any transcriptional errors are unintentional.  Orders Placed This Encounter  Procedures   Ambulatory referral to Ophthalmology     Requested  Prescriptions   Signed Prescriptions Disp Refills   losartan  (COZAAR ) 25 MG tablet 30 tablet 5    Sig: Take 1 tablet (25 mg total) by mouth daily.    Return in about 4 months (around 12/27/2023).  Concetta Dee, MD, FACP

## 2023-08-28 ENCOUNTER — Other Ambulatory Visit: Payer: Self-pay

## 2023-08-29 ENCOUNTER — Other Ambulatory Visit: Payer: Self-pay

## 2023-09-27 ENCOUNTER — Ambulatory Visit: Attending: Family Medicine | Admitting: Pharmacist

## 2023-09-27 ENCOUNTER — Other Ambulatory Visit: Payer: Self-pay

## 2023-09-27 ENCOUNTER — Encounter: Payer: Self-pay | Admitting: Pharmacist

## 2023-09-27 DIAGNOSIS — E1129 Type 2 diabetes mellitus with other diabetic kidney complication: Secondary | ICD-10-CM

## 2023-09-27 DIAGNOSIS — R809 Proteinuria, unspecified: Secondary | ICD-10-CM | POA: Diagnosis not present

## 2023-09-27 DIAGNOSIS — Z7985 Long-term (current) use of injectable non-insulin antidiabetic drugs: Secondary | ICD-10-CM

## 2023-09-27 DIAGNOSIS — Z794 Long term (current) use of insulin: Secondary | ICD-10-CM

## 2023-09-27 NOTE — Progress Notes (Signed)
 S:     No chief complaint on file.  Interpreter: May, ID #[id number]  28 y.o. male who presents for HTN and diabetes evaluation, education, and management. PMH is significant for T2DM and Liver abscess (2023).   Patient was referred by Primary Care Provider, Dr. Vicci, on 08/27/2023. At follow-up with pharmacy on 08/24/2023, patient presented with hypoglycemia. We decreased his insulin  doses and continued Trulicity .  Today, patient reports that he is doing well. He is adherent to Semglee , Humalog , and Trulicity . Brings in his Clarity app to review. Does endorse some nausea with increased dose of Trulicity  mostly the day after he takes his injection. Denies any vomiting or abdominal pain. Is eating less with increased Trulicity  dose. No changes in vision. His hypoglycemia has resolved.   Family/Social History:  -Fhx: none reported -Tobacco: never smoker  -Alcohol: not consistently   Current diabetes medications include: Semglee  68 units daily in the morning,  Humalog  14 units three times daily before meals, Trulicity  1.5 mg weekly  Insurance coverage: [employer] - Commercial Plan  Patient denies hypoglycemic events.   Patient denies polyphagia, polydipsia.  Patient denies nocturia (nighttime urination).  Patient denies neuropathy (nerve pain). Patient denies visual changes.  Patient reported dietary habits: Eats 2 meals/day - cut back on juices, sweets Breakfast: eggs, vegetables, smoothie Dinner: vegetables, salmon, fish, meat Snacks: fruits, seeds, nuts Drinks: water, sparkling water   Patient-reported exercise habits: has increased exercise - running, biking three times/week (45-60 min)  O: Date of Download: 09/27/2023, 30-day Average Glucose: 163 mg/dL Glucose Management Indicator: 7.2%  Time in Goal:  - Time in range 70-180: 70% - Time above range: 29% - Time below range: <1%  Lab Results  Component Value Date   HGBA1C 7.8 (A) 08/24/2023   BP Readings  from Last 3 Encounters:  08/27/23 118/77  05/21/23 134/87  03/20/23 122/76   BMET    Component Value Date/Time   NA 139 05/21/2023 0943   K 4.1 05/21/2023 0943   CL 100 05/21/2023 0943   CO2 24 05/21/2023 0943   GLUCOSE 195 (H) 05/21/2023 0943   GLUCOSE 172 (H) 09/06/2021 0427   BUN 12 05/21/2023 0943   CREATININE 0.83 05/21/2023 0943   CALCIUM  10.3 (H) 05/21/2023 0943   EGFR 123 05/21/2023 0943   GFRNONAA >60 09/06/2021 0427     Lipid panel     Component Value Date/Time   CHOL 267 (H) 01/30/2023 1429   TRIG 271 (H) 01/30/2023 1429   HDL 43 01/30/2023 1429   CHOLHDL 6.2 (H) 01/30/2023 1429   LDLCALC 172 (H) 01/30/2023 1429    Clinical Atherosclerotic Cardiovascular Disease (ASCVD): No  The ASCVD Risk score (Arnett DK, et al., 2019) failed to calculate for the following reasons:   The 2019 ASCVD risk score is only valid for ages 64 to 44   A/P: Diabetes longstanding currently uncontrolled with last A1c of 7.8% but much improved from last A1c of > 12%. He is now doing much better with Trulicity  and Dexcom. He is not currently hypoglycemic and no home occurrences since his last visit with me. Patient is able to verbalize appropriate hypoglycemia management plan. Medication adherence appears to be optimal.  -Continue Semglee  (insulin  glargine) 68 units daily in the morning.  -Continue Humalog  (insulin  lispro) to 14 units three times daily before meals.  -Continue Trulicity  1.5 mg weekly.  -Patient educated on purpose, proper use, and potential adverse effects of medications.  -Extensively discussed pathophysiology of diabetes, recommended lifestyle interventions,  dietary effects on blood sugar control.  -Counseled on s/sx of and management of hypoglycemia.  -A1c anticipated 11/2023.  Written patient instructions provided. Patient verbalized understanding of treatment plan.  Total time in face to face counseling 30 minutes.    Follow-up:  PCP clinic visit:  12/31/23 Pharmacy: 11/29/2023  [name], PharmD, BCACP, CPP Clinical Pharmacist [facility] & [facility] [phone]

## 2023-09-28 ENCOUNTER — Ambulatory Visit: Payer: Self-pay | Admitting: Internal Medicine

## 2023-09-28 LAB — CMP14+EGFR
ALT: 54 IU/L — ABNORMAL HIGH (ref 0–44)
AST: 33 IU/L (ref 0–40)
Albumin: 4.5 g/dL (ref 4.3–5.2)
Alkaline Phosphatase: 107 IU/L (ref 44–121)
BUN/Creatinine Ratio: 16 (ref 9–20)
BUN: 13 mg/dL (ref 6–20)
Bilirubin Total: 0.2 mg/dL (ref 0.0–1.2)
CO2: 24 mmol/L (ref 20–29)
Calcium: 10 mg/dL (ref 8.7–10.2)
Chloride: 102 mmol/L (ref 96–106)
Creatinine, Ser: 0.79 mg/dL (ref 0.76–1.27)
Globulin, Total: 3.1 g/dL (ref 1.5–4.5)
Glucose: 139 mg/dL — ABNORMAL HIGH (ref 70–99)
Potassium: 4.4 mmol/L (ref 3.5–5.2)
Sodium: 141 mmol/L (ref 134–144)
Total Protein: 7.6 g/dL (ref 6.0–8.5)
eGFR: 125 mL/min/1.73 (ref >59)

## 2023-10-04 ENCOUNTER — Other Ambulatory Visit: Payer: Self-pay

## 2023-10-29 ENCOUNTER — Other Ambulatory Visit: Payer: Self-pay

## 2023-11-29 ENCOUNTER — Telehealth: Payer: Self-pay | Admitting: Pharmacist

## 2023-11-29 ENCOUNTER — Ambulatory Visit: Attending: Family Medicine | Admitting: Pharmacist

## 2023-11-29 ENCOUNTER — Other Ambulatory Visit: Payer: Self-pay

## 2023-11-29 ENCOUNTER — Encounter: Payer: Self-pay | Admitting: Pharmacist

## 2023-11-29 ENCOUNTER — Telehealth: Payer: Self-pay

## 2023-11-29 DIAGNOSIS — E1129 Type 2 diabetes mellitus with other diabetic kidney complication: Secondary | ICD-10-CM | POA: Diagnosis not present

## 2023-11-29 DIAGNOSIS — Z794 Long term (current) use of insulin: Secondary | ICD-10-CM

## 2023-11-29 DIAGNOSIS — R809 Proteinuria, unspecified: Secondary | ICD-10-CM | POA: Diagnosis not present

## 2023-11-29 DIAGNOSIS — Z7985 Long-term (current) use of injectable non-insulin antidiabetic drugs: Secondary | ICD-10-CM | POA: Diagnosis not present

## 2023-11-29 LAB — POCT GLYCOSYLATED HEMOGLOBIN (HGB A1C): HbA1c, POC (controlled diabetic range): 7.9 % — AB (ref 0.0–7.0)

## 2023-11-29 MED ORDER — DEXCOM G7 SENSOR MISC
11 refills | Status: DC
  Filled 2023-11-29: qty 3, 30d supply, fill #0
  Filled 2024-02-01: qty 3, 30d supply, fill #1
  Filled 2024-03-03: qty 3, 30d supply, fill #2
  Filled 2024-04-08: qty 3, 30d supply, fill #3

## 2023-11-29 MED ORDER — INSULIN LISPRO (1 UNIT DIAL) 100 UNIT/ML (KWIKPEN)
20.0000 [IU] | PEN_INJECTOR | Freq: Three times a day (TID) | SUBCUTANEOUS | 3 refills | Status: AC
  Filled 2023-11-29: qty 15, 25d supply, fill #0
  Filled 2024-03-03: qty 15, 25d supply, fill #1

## 2023-11-29 MED ORDER — TIRZEPATIDE 5 MG/0.5ML ~~LOC~~ SOAJ
5.0000 mg | SUBCUTANEOUS | 0 refills | Status: DC
  Filled 2023-11-29 – 2024-02-01 (×2): qty 2, 28d supply, fill #0
  Filled 2024-03-03: qty 2, 28d supply, fill #1

## 2023-11-29 MED ORDER — TRULICITY 1.5 MG/0.5ML ~~LOC~~ SOAJ
1.5000 mg | SUBCUTANEOUS | 1 refills | Status: DC
  Filled 2023-11-29: qty 6, 84d supply, fill #0

## 2023-11-29 MED ORDER — EMBECTA PEN NEEDLE ULTRAFINE 32G X 6 MM MISC
2 refills | Status: DC
  Filled 2023-11-29: qty 100, 25d supply, fill #0

## 2023-11-29 MED ORDER — TRULICITY 1.5 MG/0.5ML ~~LOC~~ SOAJ
1.5000 mg | SUBCUTANEOUS | 0 refills | Status: DC
  Filled 2023-11-29: qty 2, 28d supply, fill #0

## 2023-11-29 MED ORDER — ATORVASTATIN CALCIUM 10 MG PO TABS
10.0000 mg | ORAL_TABLET | ORAL | 1 refills | Status: AC
  Filled 2023-11-29: qty 36, 84d supply, fill #0

## 2023-11-29 MED ORDER — INSULIN GLARGINE-YFGN 100 UNIT/ML ~~LOC~~ SOPN
60.0000 [IU] | PEN_INJECTOR | Freq: Every day | SUBCUTANEOUS | 1 refills | Status: DC
  Filled 2023-11-29: qty 60, 100d supply, fill #0

## 2023-11-29 NOTE — Telephone Encounter (Signed)
 Pharmacy Patient Advocate Encounter   Received notification from Patient Pharmacy that prior authorization for MOUNJARO  is required/requested.   Insurance verification completed.   The patient is insured through [employer] (COMMERCIAL) .   Per test claim: PA required; PA submitted to above mentioned insurance via CoverMyMeds Key/confirmation #/EOC [id number] Status is pending

## 2023-11-29 NOTE — Progress Notes (Signed)
 S:     No chief complaint on file.  Interpreter: Bobette BALBOA #[id number]  28 y.o. male who presents for HTN and diabetes evaluation, education, and management. PMH is significant for T2DM and Liver abscess (2023).   Patient was referred by Primary Care Provider, Dr. Vicci, on 08/27/2023. At follow-up with pharmacy on 09/27/2023 his CGM report looked good. I made no changes.   Today, patient admits to dietary indiscretion, particularly over the last 14 days. The last few months have been going well. Denies any side effects with his insulins or Trulicity . Adherent to and verbalizes compliance with 68 units daily of Semglee  and 14u TID of Humalog . Taking Trulicity  once weekly. Denies any lows recently, but will feel symptoms when sugars reach the 60-70s.  Family/Social History:  -Fhx: none reported -Tobacco: never smoker  -Alcohol: not consistently   Current diabetes medications include: Semglee  68 units daily in the morning,  Humalog  14 units three times daily before meals, Trulicity  1.5 mg weekly  Insurance coverage: [employer] - Commercial Plan  Patient denies hypoglycemic events.   Patient denies polyphagia, polydipsia.  Patient denies nocturia (nighttime urination).  Patient denies neuropathy (nerve pain). Patient denies visual changes.  Patient reported dietary habits:  -Admits to increasing carbohydrate intake over the last several weeks.  -Eats 2 meals/day - cut back on juices, sweets -Breakfast: eggs, vegetables, smoothie -Dinner: vegetables, salmon, fish, meat -Snacks: fruits, seeds, nuts -Drinks: water, sparkling water   Patient-reported exercise habits: has increased exercise - running, biking three times/week (45-60 min)  O: Date of Download: 11/29/2023, 14-day Average Glucose: 232 mg/dL Glucose Management Indicator: 8.9%  Time in Goal:  - Time in range 70-180: 29% - Time above range: 74% - Time below range: 0%  Lab Results  Component Value Date    HGBA1C 7.9 (A) 11/29/2023   BP Readings from Last 3 Encounters:  08/27/23 118/77  05/21/23 134/87  03/20/23 122/76   BMET    Component Value Date/Time   NA 141 09/27/2023 1616   K 4.4 09/27/2023 1616   CL 102 09/27/2023 1616   CO2 24 09/27/2023 1616   GLUCOSE 139 (H) 09/27/2023 1616   GLUCOSE 172 (H) 09/06/2021 0427   BUN 13 09/27/2023 1616   CREATININE 0.79 09/27/2023 1616   CALCIUM  10.0 09/27/2023 1616   EGFR 125 09/27/2023 1616   GFRNONAA >60 09/06/2021 0427     Lipid panel     Component Value Date/Time   CHOL 267 (H) 01/30/2023 1429   TRIG 271 (H) 01/30/2023 1429   HDL 43 01/30/2023 1429   CHOLHDL 6.2 (H) 01/30/2023 1429   LDLCALC 172 (H) 01/30/2023 1429    Clinical Atherosclerotic Cardiovascular Disease (ASCVD): No  The ASCVD Risk score (Arnett DK, et al., 2019) failed to calculate for the following reasons:   The 2019 ASCVD risk score is only valid for ages 6 to 28   A/P: Diabetes longstanding currently uncontrolled. A1c today unchanged from 3 months prior (7.9%) with 7.8% 3 months ago. CGM indicates worsening control. Will decrease basal and increase bolus insulin  due to this. Additionally, I think Mounjaro  would assist in improved glycemic control and may help decrease his large exogenous insulin  requirement. Will pursue approval for this. He is not currently hypoglycemic and no home occurrences since his last visit with me. Patient is able to verbalize appropriate hypoglycemia management plan. Medication adherence appears to be optimal.  -DECREASE Semglee  (insulin  glargine) TO 60 units daily in the morning.  -INCREASE Humalog  (insulin   lispro) to 20 units three times daily before meals.  -DISCONTINUE Trulicity . Patient may continue remaining supply of 1.5 mg weekly until PA for Mounjaro  approved.  -START Mounjaro  5 mg weekly. Will pursue PA approval for this.  -Patient educated on purpose, proper use, and potential adverse effects of medications.  -Extensively  discussed pathophysiology of diabetes, recommended lifestyle interventions, dietary effects on blood sugar control.  -Counseled on s/sx of and management of hypoglycemia.  -A1c anticipated 02/2024.  Written patient instructions provided. Patient verbalized understanding of treatment plan.  Total time in face to face counseling 30 minutes.    Follow-up:  Me in 1 month.  [name], PharmD, JAQUELINE, CPP Clinical Pharmacist [facility] & [facility] [phone]

## 2023-11-29 NOTE — Telephone Encounter (Signed)
 Can we attempt a PA for Mounjaro ?

## 2023-11-30 ENCOUNTER — Telehealth: Payer: Self-pay

## 2023-11-30 ENCOUNTER — Other Ambulatory Visit: Payer: Self-pay

## 2023-11-30 NOTE — Telephone Encounter (Signed)
 Pharmacy Patient Advocate Encounter  Received notification from [facility] [employer]) that Prior Authorization for MOUNJARO  has been APPROVED from 11/29/2023 to 11/28/2024

## 2023-11-30 NOTE — Telephone Encounter (Signed)
 Call placed to patient to inform him of the approval. Verified I was speaking to the patient using two identifiers. Introduced myself and the reason for my call.   Explained that the Mounjaro  was approved and he can fill this after exhausting his current supply of Trulicity . Pt verbalizes understanding.  [name], PharmD, JAQUELINE, CPP Clinical Pharmacist [facility] & [facility] [phone]

## 2023-12-05 ENCOUNTER — Other Ambulatory Visit: Payer: Self-pay

## 2023-12-31 ENCOUNTER — Other Ambulatory Visit: Payer: Self-pay

## 2023-12-31 ENCOUNTER — Ambulatory Visit: Attending: Internal Medicine | Admitting: Internal Medicine

## 2023-12-31 ENCOUNTER — Encounter: Payer: Self-pay | Admitting: Internal Medicine

## 2023-12-31 VITALS — BP 126/86 | HR 86 | Temp 98.2°F | Ht 66.0 in | Wt 196.0 lb

## 2023-12-31 DIAGNOSIS — E1159 Type 2 diabetes mellitus with other circulatory complications: Secondary | ICD-10-CM | POA: Diagnosis not present

## 2023-12-31 DIAGNOSIS — E785 Hyperlipidemia, unspecified: Secondary | ICD-10-CM

## 2023-12-31 DIAGNOSIS — R809 Proteinuria, unspecified: Secondary | ICD-10-CM

## 2023-12-31 DIAGNOSIS — Z794 Long term (current) use of insulin: Secondary | ICD-10-CM

## 2023-12-31 DIAGNOSIS — I152 Hypertension secondary to endocrine disorders: Secondary | ICD-10-CM

## 2023-12-31 DIAGNOSIS — E1169 Type 2 diabetes mellitus with other specified complication: Secondary | ICD-10-CM

## 2023-12-31 DIAGNOSIS — E1129 Type 2 diabetes mellitus with other diabetic kidney complication: Secondary | ICD-10-CM | POA: Diagnosis not present

## 2023-12-31 MED ORDER — LOSARTAN POTASSIUM 25 MG PO TABS
25.0000 mg | ORAL_TABLET | Freq: Every day | ORAL | 5 refills | Status: AC
  Filled 2023-12-31: qty 90, 90d supply, fill #0

## 2023-12-31 MED ORDER — INSULIN GLARGINE-YFGN 100 UNIT/ML ~~LOC~~ SOPN
63.0000 [IU] | PEN_INJECTOR | Freq: Every day | SUBCUTANEOUS | 1 refills | Status: AC
  Filled 2023-12-31: qty 57, 90d supply, fill #0

## 2023-12-31 MED ORDER — EMBECTA PEN NEEDLE ULTRAFINE 32G X 6 MM MISC
2 refills | Status: AC
  Filled 2023-12-31: qty 100, 25d supply, fill #0
  Filled 2024-03-03 – 2024-03-17 (×3): qty 100, 25d supply, fill #1

## 2023-12-31 NOTE — Progress Notes (Signed)
 Patient ID: Adam Skinner, male    DOB: 1995-04-02  MRN: [id number]  CC: Diabetes (DM f/u. Med refill./Dry skin on bilateral sides of nose X4 mo/Reports ophthalmology appt for 03/2024/Yes to flu vax)   Subjective: Adam Skinner is a 28 y.o. male who presents for chronic ds management. 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 #Adam Skinner [id number]   Discussed the use of AI scribe software for clinical note transcription with the patient, who gave verbal consent to proceed.  History of Present Illness Cap [name] is a 28 year old male with diabetes and hyperlipidemia who presents for a four-month follow-up visit.  He was previously on Trulicity  1.5 mg once a wk but was changed to Mounjaro  5 mg weekly by clinical pharmacist last mth. He has not yet started Mounjaro , as he was finishing his Trulicity  supply, with the last dose taken last week. He is currently on Semglee  insulin , 60 units daily, and Humalog  insulin , 20 units with meals. His last A1c was 7.9%, with a goal of less than 7%. He requires refills for his daily insulin , insulin  needles, and glucose sensors. He has an upcoming diabetic eye exam scheduled for January.  He experiences difficulty maintaining his diet due to a recent job change to a [place], which has affected his eating habits. He finds it challenging to avoid overeating and sugary drinks in this environment.  For hyperlipidemia, he takes atorvastatin  on Mondays, Wednesdays, and Fridays. He also has a history of hypertension and proteinuria, for which he was prescribed losartan  25 mg, but he has not received it from the pharmacy since June.      Patient Active Problem List   Diagnosis Date Noted   Type 2 diabetes mellitus with microalbuminuria, with long-term current use of insulin   (HCC) 01/04/2022   Hyponatremia 08/31/2021   Bandemia 08/31/2021   Normocytic anemia 08/31/2021   Language barrier 08/31/2021   Epigastric pain    Liver abscess 08/28/2021   Uncontrolled diabetes mellitus with hyperglycemia, without long-term current use of insulin  (HCC) 08/28/2021     Current Outpatient Medications on File Prior to Visit  Medication Sig Dispense Refill   atorvastatin  (LIPITOR) 10 MG tablet Take 1 tablet (10 mg total) by mouth every Monday, Wednesday, and Friday. 36 tablet 1   blood glucose meter kit and supplies Use up to 4 times a day as directed 1 each 0   Continuous Glucose Receiver (DEXCOM G7 RECEIVER) DEVI Use to check blood glucose continuously. 1 each 0   Continuous Glucose Sensor (DEXCOM G7 SENSOR) MISC Use to check blood glucose throughout the day. Changes sensors once every 10 days. 3 each 11   glucose blood (TRUE METRIX BLOOD GLUCOSE TEST) test strip use up to 4 times a day as directed 200 each 0   insulin  lispro (HUMALOG ) 100 UNIT/ML KwikPen Inject 20 Units into the skin 3 (three) times daily. 15 mL 3   tirzepatide  (MOUNJARO ) 5 MG/0.5ML Pen Inject 5 mg into the skin once a week. 6 mL 0   TRUEplus Lancets 28G MISC use up to 4 times a day as directed 200 each 0   No current facility-administered medications on file prior to visit.    Allergies  Allergen Reactions   Porcine (Pork) Protein-Containing Drug Products     Social History   Socioeconomic History   Marital  status: Married    Spouse name: Not on file   Number of children: Not on file   Years of education: Not on file   Highest education level: Bachelor's degree (e.g., BA, AB, BS)  Occupational History   Not on file  Tobacco Use   Smoking status: Never   Smokeless tobacco: Never  Vaping Use   Vaping status: Never Used  Substance and Sexual Activity   Alcohol use: Not Currently   Drug use: Never   Sexual activity: Not on file  Other Topics Concern   Not on file  Social History  Narrative   Not on file   Social Drivers of Health   Financial Resource Strain: Low Risk  (12/30/2023)   Overall Financial Resource Strain (CARDIA)    Difficulty of Paying Living Expenses: Not hard at all  Food Insecurity: No Food Insecurity (12/30/2023)   Hunger Vital Sign    Worried About Running Out of Food in the Last Year: Never true    Ran Out of Food in the Last Year: Never true  Transportation Needs: No Transportation Needs (12/30/2023)   PRAPARE - Administrator, Civil Service (Medical): No    Lack of Transportation (Non-Medical): No  Physical Activity: Insufficiently Active (12/30/2023)   Exercise Vital Sign    Days of Exercise per Week: 1 day    Minutes of Exercise per Session: 60 min  Stress: Patient Declined (12/30/2023)   [employer] of Occupational Health - Occupational Stress Questionnaire    Feeling of Stress: Patient declined  Social Connections: Socially Integrated (12/30/2023)   Social Connection and Isolation Panel    Frequency of Communication with Friends and Family: More than three times a week    Frequency of Social Gatherings with Friends and Family: More than three times a week    Attends Religious Services: More than 4 times per year    Active Member of [employer] or Organizations: Yes    Attends Engineer, structural: More than 4 times per year    Marital Status: Married  Catering manager Violence: Not At Risk (02/20/2023)   Humiliation, Afraid, Rape, and Kick questionnaire    Fear of Current or Ex-Partner: No    Emotionally Abused: No    Physically Abused: No    Sexually Abused: No    Family History  Problem Relation Age of Onset   Diabetes Mother    Diabetes Father    Heart disease Neg Hx     Past Surgical History:  Procedure Laterality Date   IR RADIOLOGIST EVAL & MGMT  09/15/2021   IR RADIOLOGIST EVAL & MGMT  09/27/2021   LIVER SURGERY      ROS: Review of Systems Negative except as stated above  PHYSICAL  EXAM: BP 126/86   Pulse 86   Temp 98.2 F (36.8 C) (Oral)   Ht 5' 6 (1.676 m)   Wt 196 lb (88.9 kg)   SpO2 99%   BMI 31.64 kg/m   Wt Readings from Last 3 Encounters:  12/31/23 196 lb (88.9 kg)  08/27/23 192 lb (87.1 kg)  06/25/23 189 lb (85.7 kg)    Physical Exam   General appearance - alert, well appearing, young Hispanic male and in no distress Mental status - normal mood, behavior, speech, dress, motor activity, and thought processes Chest - clear to auscultation, no wheezes, rales or rhonchi, symmetric air entry Heart - normal rate, regular rhythm, normal S1, S2, no murmurs, rubs, clicks or gallops Extremities -  peripheral pulses normal, no pedal edema, no clubbing or cyanosis     Latest Ref Rng & Units 09/27/2023    4:16 PM 06/25/2023    2:46 PM 05/21/2023    9:43 AM  CMP  Glucose 70 - 99 mg/dL 860   804   BUN 6 - 20 mg/dL 13   12   Creatinine 9.23 - 1.27 mg/dL 9.20   9.16   Sodium 865 - 144 mmol/L 141   139   Potassium 3.5 - 5.2 mmol/L 4.4   4.1   Chloride 96 - 106 mmol/L 102   100   CO2 20 - 29 mmol/L 24   24   Calcium  8.7 - 10.2 mg/dL 89.9   89.6   Total Protein 6.0 - 8.5 g/dL 7.6  7.3  7.0   Total Bilirubin 0.0 - 1.2 mg/dL 0.2  0.3  0.3   Alkaline Phos 44 - 121 IU/L 107  144  135   AST 0 - 40 IU/L 33  30  42   ALT 0 - 44 IU/L 54  46  53    Lipid Panel     Component Value Date/Time   CHOL 267 (H) 01/30/2023 1429   TRIG 271 (H) 01/30/2023 1429   HDL 43 01/30/2023 1429   CHOLHDL 6.2 (H) 01/30/2023 1429   LDLCALC 172 (H) 01/30/2023 1429    CBC    Component Value Date/Time   WBC 10.3 10/19/2022 0855   WBC 7.6 09/06/2021 0427   RBC 5.40 10/19/2022 0855   RBC 4.02 (L) 09/06/2021 0427   HGB 15.5 10/19/2022 0855   HCT 46.3 10/19/2022 0855   PLT 356 10/19/2022 0855   MCV 86 10/19/2022 0855   MCH 28.7 10/19/2022 0855   MCH 26.4 09/06/2021 0427   MCHC 33.5 10/19/2022 0855   MCHC 31.4 09/06/2021 0427   RDW 12.2 10/19/2022 0855   LYMPHSABS 2.5  10/03/2021 1040   MONOABS 1.2 (H) 09/01/2021 0432   EOSABS 0.0 10/03/2021 1040   BASOSABS 0.0 10/03/2021 1040    ASSESSMENT AND PLAN: 1. Type 2 diabetes mellitus with microalbuminuria, with long-term current use of insulin  (HCC) (Primary) Not at goal. Increase Semglee  to 63 units daily Continue Humalog  20 units with meals Start Mounjaro  as previously prescribed. Will need titration. Went over potential S.E to look out for.  - losartan  (COZAAR ) 25 MG tablet; Take 1 tablet (25 mg total) by mouth daily.  Dispense: 90 tablet; Refill: 5 - Insulin  Pen Needle (EMBECTA PEN NEEDLE ULTRAFINE) 32G X 6 MM MISC; Use to inject insulin  4 times daily  Dispense: 400 each; Refill: 2  2. Hypertension associated with diabetes (HCC) Not at goal. Refill sent on Cozaar  - losartan  (COZAAR ) 25 MG tablet; Take 1 tablet (25 mg total) by mouth daily.  Dispense: 90 tablet; Refill: 5  3. Hyperlipidemia associated with type 2 diabetes mellitus (HCC) Continue Lipitor 3x/wk   Patient was given the opportunity to ask questions.  Patient verbalized understanding of the plan and was able to repeat key elements of the plan.   This documentation was completed using Paediatric nurse.  Any transcriptional errors are unintentional.  No orders of the defined types were placed in this encounter.    Requested Prescriptions   Signed Prescriptions Disp Refills   insulin  glargine-yfgn (SEMGLEE ) 100 UNIT/ML Pen 60 mL 1    Sig: Inject 63 Units into the skin daily.   losartan  (COZAAR ) 25 MG tablet 90 tablet 5  Sig: Take 1 tablet (25 mg total) by mouth daily.   Insulin  Pen Needle (EMBECTA PEN NEEDLE ULTRAFINE) 32G X 6 MM MISC 400 each 2    Sig: Use to inject insulin  4 times daily    No follow-ups on file.  Barnie Louder, MD, FACP

## 2023-12-31 NOTE — Patient Instructions (Signed)
  VISIT SUMMARY: Today, you had a follow-up visit to manage your diabetes, high blood pressure, and high cholesterol. We discussed your current medications, dietary challenges, and made some adjustments to your treatment plan.  YOUR PLAN: -TYPE 2 DIABETES MELLITUS: Type 2 diabetes is a condition where your body does not use insulin  properly, leading to high blood sugar levels. Your A1c is currently 7.9%, and our goal is to get it below 7%. We will start you on Mounjaro  and monitor for any side effects. Your Semglee  insulin  dose will be increased to 63 units daily. Please try to avoid sugary drinks and reduce your intake of white carbohydrates. If you tolerate Mounjaro  well, we will increase the dose after one month. Remember, you have a diabetic eye exam scheduled for January.  -HYPERTENSION: Hypertension, or high blood pressure, can lead to serious health issues if not managed properly. You have not been taking your losartan  due to a prescription lapse. We have sent an updated prescription to your pharmacy.  -HYPERLIPIDEMIA: Hyperlipidemia means you have high levels of fats in your blood, which can increase your risk of heart disease. You are currently managing this with atorvastatin  taken three times a week.  INSTRUCTIONS: Please start taking Mounjaro  as prescribed and monitor for any side effects. Increase your Semglee  insulin  to 63 units daily. Avoid sugary drinks and reduce white carbohydrates in your diet. Pick up your losartan  prescription from the pharmacy and resume taking it as directed. Attend your diabetic eye exam in January.                      Contains text generated by Abridge.                                 Contains text generated by Abridge.

## 2024-01-01 ENCOUNTER — Ambulatory Visit: Payer: Self-pay | Attending: Internal Medicine | Admitting: Pharmacist

## 2024-01-01 ENCOUNTER — Other Ambulatory Visit: Payer: Self-pay

## 2024-01-01 ENCOUNTER — Encounter: Payer: Self-pay | Admitting: Internal Medicine

## 2024-01-01 DIAGNOSIS — R809 Proteinuria, unspecified: Secondary | ICD-10-CM | POA: Diagnosis not present

## 2024-01-01 DIAGNOSIS — Z7985 Long-term (current) use of injectable non-insulin antidiabetic drugs: Secondary | ICD-10-CM | POA: Diagnosis not present

## 2024-01-01 DIAGNOSIS — E1129 Type 2 diabetes mellitus with other diabetic kidney complication: Secondary | ICD-10-CM | POA: Diagnosis not present

## 2024-01-01 DIAGNOSIS — Z794 Long term (current) use of insulin: Secondary | ICD-10-CM

## 2024-01-01 NOTE — Progress Notes (Signed)
 S:     No chief complaint on file.  Interpreter: Norman BALBOA #[id number]  28 y.o. male who presents for HTN and diabetes evaluation, education, and management. PMH is significant for T2DM and Liver abscess (2023).   Patient was referred by Primary Care Provider, Dr. Vicci, on 12/31/2023. Pharmacy last saw him on 11/29/2023. We adjusted his insulin  and changed his Trulicity  to Mounjaro .   Today, pt reports that he's doing well. He endorses compliance to his insulin  as prescribed. He used his last injection of Trulicity  last week (Wednesdays). Has not started the Mounjaro  yet.   Family/Social History:  -Fhx: none reported -Tobacco: never smoker  -Alcohol: not consistently   Current diabetes medications include: Semglee  63 units daily in the morning,  Humalog  20 units three times daily before meals, Mounjaro  5 mg weekly (has not started)  Insurance coverage: [employer] - Commercial Plan  Patient denies hypoglycemic events.   Patient denies polyphagia, polydipsia.  Patient denies nocturia (nighttime urination).  Patient denies neuropathy (nerve pain). Patient denies visual changes.  Patient reported dietary habits:  -Admits to increasing carbohydrate intake over the last several weeks.  -Eats 2 meals/day - cut back on juices, sweets -Breakfast: eggs, vegetables, smoothie -Dinner: vegetables, salmon, fish, meat -Snacks: fruits, seeds, nuts -Drinks: water, sparkling water   Patient-reported exercise habits: has increased exercise - running, biking three times/week (45-60 min)  O: No CGM or GM with him today   Lab Results  Component Value Date   HGBA1C 7.9 (A) 11/29/2023   BP Readings from Last 3 Encounters:  12/31/23 126/86  08/27/23 118/77  05/21/23 134/87   BMET    Component Value Date/Time   NA 141 09/27/2023 1616   K 4.4 09/27/2023 1616   CL 102 09/27/2023 1616   CO2 24 09/27/2023 1616   GLUCOSE 139 (H) 09/27/2023 1616   GLUCOSE 172 (H) 09/06/2021  0427   BUN 13 09/27/2023 1616   CREATININE 0.79 09/27/2023 1616   CALCIUM  10.0 09/27/2023 1616   EGFR 125 09/27/2023 1616   GFRNONAA >60 09/06/2021 0427     Lipid panel     Component Value Date/Time   CHOL 267 (H) 01/30/2023 1429   TRIG 271 (H) 01/30/2023 1429   HDL 43 01/30/2023 1429   CHOLHDL 6.2 (H) 01/30/2023 1429   LDLCALC 172 (H) 01/30/2023 1429    Clinical Atherosclerotic Cardiovascular Disease (ASCVD): No  The ASCVD Risk score (Arnett DK, et al., 2019) failed to calculate for the following reasons:   The 2019 ASCVD risk score is only valid for ages 28 to 29   A/P: Diabetes longstanding currently uncontrolled. A1c last month was 7.9%. I think Mounjaro  would assist in improved glycemic control and may help decrease his large exogenous insulin  requirement. He plans to begin this tomorrow. We will follow him closely over the next several months to titrate this accordingly. He is not currently hypoglycemic and no home occurrences since his last visit with me. Patient is able to verbalize appropriate hypoglycemia management plan. Medication adherence appears to be optimal.  -Continue Semglee  (insulin  glargine) 63 units daily in the morning.  -Continue Humalog  (insulin  lispro) 20 units three times daily before meals.  -START Mounjaro  5 mg weekly.  -Patient educated on purpose, proper use, and potential adverse effects of medications.  -Extensively discussed pathophysiology of diabetes, recommended lifestyle interventions, dietary effects on blood sugar control.  -Counseled on s/sx of and management of hypoglycemia.  -A1c anticipated 02/2024.  Written patient instructions provided. Patient  verbalized understanding of treatment plan.  Total time in face to face counseling 30 minutes.    Follow-up:  Me in 1 month.  [name], PharmD, JAQUELINE, CPP Clinical Pharmacist [facility] & [facility] [phone]

## 2024-01-02 ENCOUNTER — Other Ambulatory Visit: Payer: Self-pay

## 2024-01-10 ENCOUNTER — Other Ambulatory Visit: Payer: Self-pay

## 2024-01-11 ENCOUNTER — Other Ambulatory Visit: Payer: Self-pay

## 2024-02-01 ENCOUNTER — Other Ambulatory Visit: Payer: Self-pay

## 2024-02-01 ENCOUNTER — Encounter: Payer: Self-pay | Admitting: Pharmacist

## 2024-02-01 ENCOUNTER — Ambulatory Visit: Payer: Self-pay | Attending: Internal Medicine | Admitting: Pharmacist

## 2024-02-01 DIAGNOSIS — Z794 Long term (current) use of insulin: Secondary | ICD-10-CM | POA: Diagnosis not present

## 2024-02-01 DIAGNOSIS — Z7985 Long-term (current) use of injectable non-insulin antidiabetic drugs: Secondary | ICD-10-CM | POA: Diagnosis not present

## 2024-02-01 DIAGNOSIS — R809 Proteinuria, unspecified: Secondary | ICD-10-CM | POA: Diagnosis not present

## 2024-02-01 DIAGNOSIS — E1129 Type 2 diabetes mellitus with other diabetic kidney complication: Secondary | ICD-10-CM | POA: Diagnosis not present

## 2024-02-01 NOTE — Progress Notes (Signed)
 S:     No chief complaint on file.  Interpreter: Mat Lash, ID [phone]  28 y.o. male who presents for HTN and diabetes evaluation, education, and management. PMH is significant for T2DM and Liver abscess (2023).   Patient was referred by Primary Care Provider, Dr. Vicci, on 12/31/2023. Pharmacy last saw him on 01/01/2024. At that time, he was finishing his stock of Trulicity  and had not started his Mounjaro .   Today, pt reports that he's doing well. He endorses compliance to his insulin  as prescribed. He has been unable to start the Mounjaro  after his visit with me 01/01/24. Tells me there was an issue with pharmacy. He has not been able to fill his sensors either.   Family/Social History:  -Fhx: none reported -Tobacco: never smoker  -Alcohol: not consistently   Current diabetes medications include: Semglee  63 units daily in the morning,  Humalog  20 units three times daily before meals, Mounjaro  5 mg weekly (not taking)  Insurance coverage: [employer] Car[employer] - Commercial Plan  Patient denies hypoglycemic events.   Patient denies polyphagia, polydipsia.  Patient denies nocturia (nighttime urination).  Patient denies neuropathy (nerve pain). Patient denies visual changes.  Patient reported dietary habits:  -Admits to increasing carbohydrate intake over the last several weeks.  -Eats 2 meals/day - cut back on juices, sweets -Breakfast: eggs, vegetables, smoothie -Dinner: vegetables, salmon, fish, meat -Snacks: fruits, seeds, nuts -Drinks: water, sparkling water   Patient-reported exercise habits: has increased exercise - running, biking three times/week (45-60 min)  O: No CGM or GM with him today   Lab Results  Component Value Date   HGBA1C 7.9 (A) 11/29/2023   BP Readings from Last 3 Encounters:  12/31/23 126/86  08/27/23 118/77  05/21/23 134/87   BMET    Component Value Date/Time   NA 141 09/27/2023 1616   K 4.4 09/27/2023 1616   CL 102 09/27/2023 1616    CO2 24 09/27/2023 1616   GLUCOSE 139 (H) 09/27/2023 1616   GLUCOSE 172 (H) 09/06/2021 0427   BUN 13 09/27/2023 1616   CREATININE 0.79 09/27/2023 1616   CALCIUM  10.0 09/27/2023 1616   EGFR 125 09/27/2023 1616   GFRNONAA >60 09/06/2021 0427     Lipid panel     Component Value Date/Time   CHOL 267 (H) 01/30/2023 1429   TRIG 271 (H) 01/30/2023 1429   HDL 43 01/30/2023 1429   CHOLHDL 6.2 (H) 01/30/2023 1429   LDLCALC 172 (H) 01/30/2023 1429    Clinical Atherosclerotic Cardiovascular Disease (ASCVD): No  The ASCVD Risk score (Arnett DK, et al., 2019) failed to calculate for the following reasons:   The 2019 ASCVD risk score is only valid for ages 95 to 16   A/P: Diabetes longstanding currently uncontrolled. A1c in September was 7.9%. I communicated with our pharmacy and they are able to fill both the Mounjaro  and the sensors today. Patient agrees to pick these up after his appointment today. He is not currently hypoglycemic. Patient is able to verbalize appropriate hypoglycemia management plan. -Continue Semglee  (insulin  glargine) 63 units daily in the morning.  -Continue Humalog  (insulin  lispro) 20 units three times daily before meals.  -START Mounjaro  5 mg weekly.  -Resume using CGM - refills ready for pick-up after this appt.  -Patient educated on purpose, proper use, and potential adverse effects of medications.  -Extensively discussed pathophysiology of diabetes, recommended lifestyle interventions, dietary effects on blood sugar control.  -Counseled on s/sx of and management of hypoglycemia.  -A1c anticipated  02/2024.  Written patient instructions provided. Patient verbalized understanding of treatment plan.  Total time in face to face counseling 30 minutes.    Follow-up:  Me in 1 month.  [name], PharmD, JAQUELINE, CPP Clinical Pharmacist [facility] & [facility] [phone]

## 2024-03-03 ENCOUNTER — Encounter: Payer: Self-pay | Admitting: Pharmacist

## 2024-03-03 ENCOUNTER — Ambulatory Visit: Attending: Internal Medicine | Admitting: Pharmacist

## 2024-03-03 ENCOUNTER — Other Ambulatory Visit: Payer: Self-pay

## 2024-03-03 DIAGNOSIS — Z7985 Long-term (current) use of injectable non-insulin antidiabetic drugs: Secondary | ICD-10-CM | POA: Diagnosis not present

## 2024-03-03 DIAGNOSIS — E1129 Type 2 diabetes mellitus with other diabetic kidney complication: Secondary | ICD-10-CM | POA: Diagnosis not present

## 2024-03-03 DIAGNOSIS — Z794 Long term (current) use of insulin: Secondary | ICD-10-CM

## 2024-03-03 DIAGNOSIS — R809 Proteinuria, unspecified: Secondary | ICD-10-CM

## 2024-03-03 LAB — POCT GLYCOSYLATED HEMOGLOBIN (HGB A1C): HbA1c, POC (controlled diabetic range): 8.2 % — AB (ref 0.0–7.0)

## 2024-03-03 NOTE — Progress Notes (Signed)
 "  S:     No chief complaint on file.  InterpreterBETHA Aliene Skinner #[id number]  28 y.o. male who presents for HTN and diabetes evaluation, education, and management. PMH is significant for T2DM and Liver abscess (2023).   Patient was referred by Primary Care Provider, Dr. Vicci, on 12/31/2023. Pharmacy last saw him on 02/01/24. At that time, he had started his Mounjaro  due to an issue with his pharmacy. He was also out of his sensors. I was able to collaborate with our pharmacy here and he filled Mounjaro  and his sensors after he saw me.   Today, pt reports that he's doing well. He endorses compliance to his insulin  as prescribed. Since starting Mounjaro  on 02/01/24 he denies any vomiting, abdominal pain. He had some nausea initially but this has resolved. He brings in his Dexcom receiver for review. His A1c today is 8.2%, up from 7.9% prior.  Family/Social History:  -Fhx: none reported -Tobacco: never smoker  -Alcohol: not consistently   Current diabetes medications include: Semglee  63 units daily in the morning,  Humalog  20 units three times daily before meals, Mounjaro  5 mg weekly   Insurance coverage: [employer] Car[employer] - Commercial Plan  Patient denies hypoglycemic events.   Patient denies polyphagia, polydipsia.  Patient denies nocturia (nighttime urination).  Patient denies neuropathy (nerve pain). Patient denies visual changes.  Patient reported dietary habits:  -Admits to increasing carbohydrate intake over the last several weeks.  -Eats 2 meals/day - cut back on juices, sweets -Breakfast: eggs, vegetables, smoothie -Dinner: vegetables, salmon, fish, meat -Snacks: fruits, seeds, nuts -Drinks: water, sparkling water   Patient-reported exercise habits: has increased exercise - running, biking three times/week (45-60 min)  O: Date of Download: 03/03/24 % Time CGM is active: 99% Average Glucose: 134 mg/dL Glucose Management Indicator: 6.5  Glucose Variability: N/A (goal  <36%) Time in Goal:  - Time in range 70-180: 84% - Time above range: 14% - Time below range: 2% Observed patterns:   Lab Results  Component Value Date   HGBA1C 8.2 (A) 03/03/2024   BP Readings from Last 3 Encounters:  12/31/23 126/86  08/27/23 118/77  05/21/23 134/87   BMET    Component Value Date/Time   NA 141 09/27/2023 1616   K 4.4 09/27/2023 1616   CL 102 09/27/2023 1616   CO2 24 09/27/2023 1616   GLUCOSE 139 (H) 09/27/2023 1616   GLUCOSE 172 (H) 09/06/2021 0427   BUN 13 09/27/2023 1616   CREATININE 0.79 09/27/2023 1616   CALCIUM  10.0 09/27/2023 1616   EGFR 125 09/27/2023 1616   GFRNONAA >60 09/06/2021 0427     Lipid panel     Component Value Date/Time   CHOL 267 (H) 01/30/2023 1429   TRIG 271 (H) 01/30/2023 1429   HDL 43 01/30/2023 1429   CHOLHDL 6.2 (H) 01/30/2023 1429   LDLCALC 172 (H) 01/30/2023 1429    Clinical Atherosclerotic Cardiovascular Disease (ASCVD): No  The ASCVD Risk score (Arnett DK, et al., 2019) failed to calculate for the following reasons:   The 2019 ASCVD risk score is only valid for ages 3 to 68   * - Cholesterol units were assumed   A/P: Diabetes longstanding currently uncontrolled given his A1c of 8.2% today. However, we just recently were able to start Mounjaro  last month. His TIR since his visit with me last month is 84%. His avg glucose is 134 mg/dL with a GMI of 3.4%. He is not currently hypoglycemic. Patient is able to verbalize appropriate hypoglycemia  management plan. -Continue Semglee  (insulin  glargine) 63 units daily in the morning.  -Continue Humalog  (insulin  lispro) 20 units three times daily before meals.  -Continue Mounjaro  5 mg weekly.  -Continue using CGM. -Patient educated on purpose, proper use, and potential adverse effects of medications.  -Extensively discussed pathophysiology of diabetes, recommended lifestyle interventions, dietary effects on blood sugar control.  -Counseled on s/sx of and management of  hypoglycemia.  -A1c anticipated 05/2024.  Written patient instructions provided. Patient verbalized understanding of treatment plan.  Total time in face to face counseling 30 minutes.    Follow-up:  Me in 1 month.  Adam Skinner, PharmD, JAQUELINE, CPP Clinical Pharmacist [facility] & [facility] [phone]  "

## 2024-03-14 ENCOUNTER — Other Ambulatory Visit: Payer: Self-pay

## 2024-03-17 ENCOUNTER — Other Ambulatory Visit: Payer: Self-pay

## 2024-03-26 ENCOUNTER — Telehealth: Payer: Self-pay | Admitting: Internal Medicine

## 2024-03-26 NOTE — Progress Notes (Signed)
 "  S:    Chief Complaint  Patient presents with   Diabetes   Interpreter: Will #[id number]  29 year old male who presents for diabetes evaluation, education, and management. PMH is significant for T2DM and liver abscess (2023).   The patient was referred by Primary Care Provider, Dr. Vicci, on 12/31/2023. Pharmacy last saw him on 03/03/24, where the patient reported starting Mounjaro  on 02/01/24 and was tolerating it well. No medication changes were made.  At today's visit, the patient presents in good spirits. He reports adherence with all medications and denies any side effects or concerns at this time. He brings in his CGM data for review. He does note some slight pain in his abdomen that has been occurring for the past 4-5 days. He states that the pain is very minimal. Discussed appropriate use of over-the-counter medications (e.g., Tylenol ) for minimal pain, and if the pain becomes severe and inhibits his activity of daily living, to seek medical attention.   Family/Social History:  Fhx: none reported Tobacco: never smoker  Alcohol: not consistently   Current diabetes medications include:  Semglee  63 units daily in the morning Humalog  20 units three times daily before meals  Mounjaro  5 mg weekly   Insurance coverage: [employer] - Commercial Plan  Patient denies hypoglycemic events.   Patient denies polyphagia, polydipsia.  Patient denies nocturia (nighttime urination).  Patient denies neuropathy (nerve pain). Patient denies visual changes.  Patient reported dietary habits: Eats 2 meals/day - has reduced sugar intake since last visit Breakfast: eggs, vegetables, smoothie Dinner: vegetables, salmon, fish, meat Snacks: fruits, seeds, nuts Drinks: water, sparkling water   Patient-reported exercise habits: has increased exercise - running, biking three times/week (45-60 min)  O: Date of Download: 03/31/2024, 30 day view Average Glucose: 192 mg/dL Glucose  Management Indicator: 8.1%  Time in Goal:  Time in range 70-180: 47% Time above range: 29 + 23% Time below range: 0%   Lab Results  Component Value Date   HGBA1C 8.2 (A) 03/03/2024   BP Readings from Last 3 Encounters:  12/31/23 126/86  08/27/23 118/77  05/21/23 134/87   BMET    Component Value Date/Time   NA 141 09/27/2023 1616   K 4.4 09/27/2023 1616   CL 102 09/27/2023 1616   CO2 24 09/27/2023 1616   GLUCOSE 139 (H) 09/27/2023 1616   GLUCOSE 172 (H) 09/06/2021 0427   BUN 13 09/27/2023 1616   CREATININE 0.79 09/27/2023 1616   CALCIUM  10.0 09/27/2023 1616   EGFR 125 09/27/2023 1616   GFRNONAA >60 09/06/2021 0427     Lipid panel     Component Value Date/Time   CHOL 267 (H) 01/30/2023 1429   TRIG 271 (H) 01/30/2023 1429   HDL 43 01/30/2023 1429   CHOLHDL 6.2 (H) 01/30/2023 1429   LDLCALC 172 (H) 01/30/2023 1429   Clinical Atherosclerotic Cardiovascular Disease (ASCVD): No  The ASCVD Risk score (Arnett DK, et al., 2019) failed to calculate for the following reasons:   The 2019 ASCVD risk score is only valid for ages 55 to 39   * - Cholesterol units were assumed   A/P: Diabetes is longstanding and currently uncontrolled, given his A1c of 8.2% on 03/03/24 (increased from 7.9% on 11/29/23). According to CGM data, his GMI is 8.1% with 47% in the target range. He does not have any recorded low readings in the past month, and denies any symptoms of hypoglycemia. Discussed lifestyle changes and increasing Mounjaro  to assist with glucose control. Encouraged him to  call the clinic if he notices any glucose readings below 70 mg/dL.  Continue Semglee  63 units daily Continue Humalog  20 units TID AC Increase Mounjaro  to 7.5 mg weekly Patient educated on purpose, proper use, and potential adverse effects of medications.  Extensively discussed pathophysiology of diabetes, recommended lifestyle interventions, dietary effects on blood sugar control.  Counseled on s/sx of and  management of hypoglycemia.  A1c anticipated 05/2024.  Written patient instructions provided. Patient verbalized understanding of treatment plan.  Total time in face to face counseling 30 minutes.    Follow-up:  PharmD: 05/26/24 PCP: 05/02/24  Woodie Jock, PharmD PGY1 Pharmacy Resident  03/31/2024  [name], PharmD, BCACP, CPP Clinical Pharmacist [facility] [phone]  "

## 2024-03-26 NOTE — Telephone Encounter (Signed)
 This pt has a f/u with you on the 19th of Jan then he sees me in Feb. FYI, his insurance now prefers Lantus  and Engineer, Maintenance. Please make the switch when you see him.

## 2024-03-31 ENCOUNTER — Ambulatory Visit: Payer: Self-pay | Attending: Internal Medicine | Admitting: Pharmacist

## 2024-03-31 ENCOUNTER — Other Ambulatory Visit: Payer: Self-pay

## 2024-03-31 ENCOUNTER — Encounter: Payer: Self-pay | Admitting: Pharmacist

## 2024-03-31 DIAGNOSIS — E1129 Type 2 diabetes mellitus with other diabetic kidney complication: Secondary | ICD-10-CM

## 2024-03-31 DIAGNOSIS — Z794 Long term (current) use of insulin: Secondary | ICD-10-CM

## 2024-03-31 DIAGNOSIS — E119 Type 2 diabetes mellitus without complications: Secondary | ICD-10-CM

## 2024-03-31 DIAGNOSIS — Z7985 Long-term (current) use of injectable non-insulin antidiabetic drugs: Secondary | ICD-10-CM

## 2024-03-31 MED ORDER — TIRZEPATIDE 7.5 MG/0.5ML ~~LOC~~ SOAJ
7.5000 mg | SUBCUTANEOUS | 1 refills | Status: DC
  Filled 2024-03-31: qty 6, 84d supply, fill #0

## 2024-04-01 ENCOUNTER — Other Ambulatory Visit: Payer: Self-pay

## 2024-04-05 ENCOUNTER — Telehealth: Payer: Self-pay | Admitting: Internal Medicine

## 2024-04-05 MED ORDER — SEMAGLUTIDE(0.25 OR 0.5MG/DOS) 2 MG/3ML ~~LOC~~ SOPN
0.5000 mg | PEN_INJECTOR | SUBCUTANEOUS | 0 refills | Status: AC
  Filled 2024-04-05 – 2024-04-07 (×2): qty 3, 28d supply, fill #0

## 2024-04-05 NOTE — Telephone Encounter (Signed)
-----   Message from Garnette LITTIE Salinas Ausdall sent at 04/02/2024  4:20 PM EST ----- Looping in Dr. Vicci.   Dr. Vicci,   Patient's insurance has changed. He was doing well on and we were titrating Mounjaro . Now, his insurance is requiring trial and failure of all 3 preferred GLP-1 RA's before Mounjaro  will be covered.   He has already tried Trulicity . He has not tried Ozempic . Can we change Mounjaro  to Ozempic  in this case? He has tolerated the 1.5 mg Trulicity  dose. I think we can safely try Ozempic  1 mg weekly and titrate to 2 mg after four weeks of the 1 mg dose. What are your thoughts? ----- Message ----- From: Chrys Burnard MATSU, CPhT Sent: 04/02/2024   3:52 PM EST To: Garnette LITTIE Salinas Tonia, RPH-CPP  Mounjaro  PA has been denied. Per insurance, trial of all 3 preferred meds required. Trulicity , Victoza and Ozempic /Rybelsus .

## 2024-04-05 NOTE — Telephone Encounter (Signed)
 Luke/Kelly: will change to Ozempic  starting at 0.5 mg. I will have Clarisa call him but before she does, please vverify cost of Ozempic  for pt. When I entered the order, cost of about $190 coming up.  Clarisa: Let pt know that his insurance will no longer pay for Mounjaro .  We will need to change to Ozempic  which is similar to Trulicity  that he was on previously. Still once weekly dosing. Once he finishes his current supply of Mounjaro , he should start the Ozempic  on the same day of the week that he was taking the Mounjaro . Will start at the 2nd highest dose of 0.5 mg once a wk. Will have pharmacist show him how to administer the med to himself.

## 2024-04-06 ENCOUNTER — Other Ambulatory Visit: Payer: Self-pay

## 2024-04-06 ENCOUNTER — Telehealth (HOSPITAL_COMMUNITY): Payer: Self-pay

## 2024-04-07 ENCOUNTER — Other Ambulatory Visit: Payer: Self-pay

## 2024-04-07 ENCOUNTER — Telehealth: Payer: Self-pay

## 2024-04-07 ENCOUNTER — Other Ambulatory Visit (HOSPITAL_COMMUNITY): Payer: Self-pay

## 2024-04-07 NOTE — Telephone Encounter (Signed)
 Pharmacy Patient Advocate Encounter  Received notification from [employer] that Prior Authorization for OZEMPIC  has been APPROVED from 04/07/2024 to 04/07/2025   PA #/Case ID/Reference #: [id number]

## 2024-04-08 ENCOUNTER — Telehealth: Payer: Self-pay | Admitting: Internal Medicine

## 2024-04-08 ENCOUNTER — Other Ambulatory Visit: Payer: Self-pay

## 2024-04-08 NOTE — Telephone Encounter (Signed)
 Copied from CRM #[id number]. Topic: Clinical - Prescription Issue >> Apr 08, 2024  4:13 PM Alexandria E wrote:  Reason for CRM: Rocky from the [facility] called in stating that patient's insurance does not cover his Dexcom sensors. Rocky ran a test claim and his insurance would cover the freestyle libre 3 plus. Questioning if patient can get switched over to the new sensor. Callback number for Rocky is [phone].

## 2024-04-09 ENCOUNTER — Other Ambulatory Visit: Payer: Self-pay | Admitting: Pharmacist

## 2024-04-09 ENCOUNTER — Other Ambulatory Visit: Payer: Self-pay

## 2024-04-09 MED ORDER — FREESTYLE LIBRE 3 PLUS SENSOR MISC
6 refills | Status: AC
  Filled 2024-04-09: qty 2, 30d supply, fill #0

## 2024-04-09 MED ORDER — FREESTYLE LIBRE 3 READER DEVI
0 refills | Status: AC
  Filled 2024-04-09: qty 1, 30d supply, fill #0

## 2024-04-09 NOTE — Telephone Encounter (Signed)
 Looks like [place] sent order already.

## 2024-04-09 NOTE — Telephone Encounter (Signed)
 Called & spoke to the patient. Verified name & DOB. Informed that his insurance will no longer pay for Mounjaro .  We will need to change to Ozempic  which is similar to Trulicity  that he was on previously. Still once weekly dosing. Once he finishes his current supply of Mounjaro , he should start the Ozempic  on the same day of the week that he was taking the Mounjaro . Will start at the 2nd highest dose of 0.5 mg once a wk. Will have pharmacist show him how to administer the med to himself.  Informed that Burnard was able to get a PA approved and a coupon brought it down from $190 to $95 for a month supply. The key thing it his deductible. It reset at the beginning of 2026. Once his deductible is met his Ozempic  copay will go down. I'm not sure to what exactly, but it will be a drastic decrease.  Patient is agreeable to the Ozempic  and copays until deductible is met.

## 2024-04-09 NOTE — Telephone Encounter (Signed)
 Called & spoke to the patient. Verified name & DOB. Informed that his libre sensors were sent in. Informed that Dexcom is not covered and that the sensors will be pricey too but there is a coupon online if he needs it. It brings his sensors down to $75. If he cannot do that, he will have to go back to checking blood sugars by finger stick. Patient is agreeable to the sensors with the discount leaving at a total of $75. No further questions or concerns.  Patient agreeable to Ozempic  and copays until deductible is met.  Sending as an FIN[employer]CIAL PL[employer]NER.

## 2024-04-10 ENCOUNTER — Other Ambulatory Visit: Payer: Self-pay

## 2024-04-17 ENCOUNTER — Other Ambulatory Visit: Payer: Self-pay

## 2024-04-18 ENCOUNTER — Other Ambulatory Visit: Payer: Self-pay

## 2024-05-02 ENCOUNTER — Ambulatory Visit: Admitting: Internal Medicine

## 2024-05-26 ENCOUNTER — Ambulatory Visit: Payer: Self-pay | Admitting: Pharmacist

## 2024-06-12 IMAGING — MR MR ABDOMEN WO/W CM
19 series · 48 of 48 positions shown · IV contrast (5 ML GDAVIST)
Comparison: CT on 08/28/2021

CLINICAL DATA: Indeterminate liver lesions on recent CT.

EXAM:
MRI ABDOMEN WITHOUT AND WITH CONTRAST
TECHNIQUE: Multiplanar multisequence MR imaging of the abdomen was performed
both before and after the administration of intravenous contrast.
CONTRAST:  6mL GADAVIST GADOBUTROL 1 MMOL/ML IV SOLN

[Series 6: T2 fat-sat · axial · 6.0mm · 1.25mm/px · 1 of 42 slices shown]
[im 1/42]
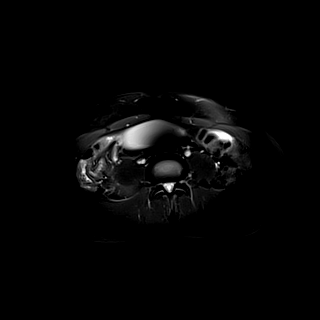

[Series 7: T1 · axial · 3.0mm · 1.25mm/px · z∈[-74,+187]mm · 2 of 88 slices shown (1 of 2)]
[im 1/88]
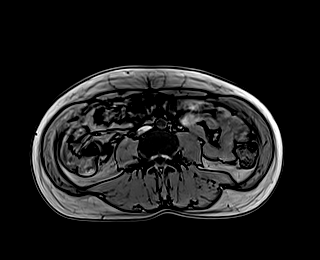
[im 88/88]
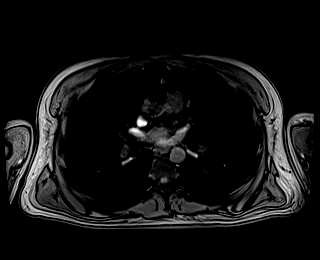

[Series 8: T1 · axial · 3.0mm · 1.25mm/px · z∈[-74,+187]mm · 3 of 88 slices shown (2 of 2)]
[im 1/88]
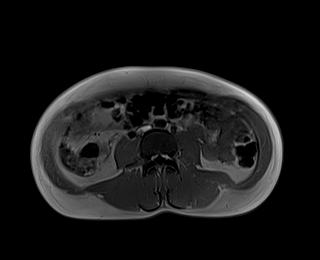
[im 44/88]
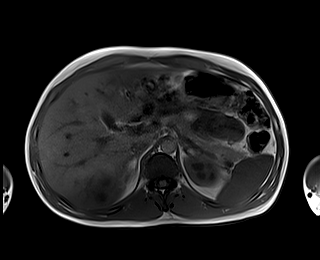
[im 88/88]
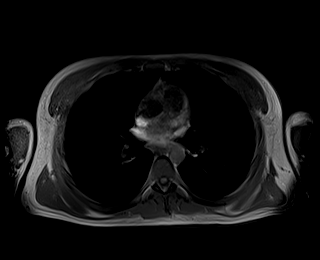

[Series 9: DWI · axial · 6.0mm · 1.49mm/px · z∈[-115,+181]mm · 3 of 84 slices shown (1 of 2)]
[im 1/84]
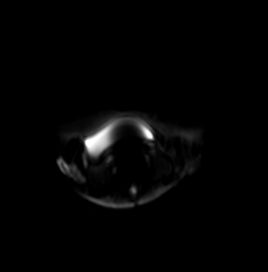
[im 42/84]
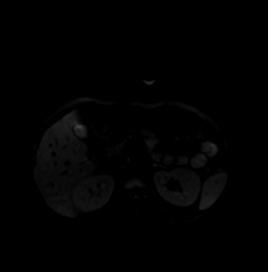
[im 84/84]
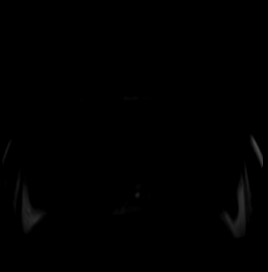

[Series 10: DWI · axial · 6.0mm · 1.49mm/px · 1 of 42 slices shown (2 of 2)]
[im 1/42]
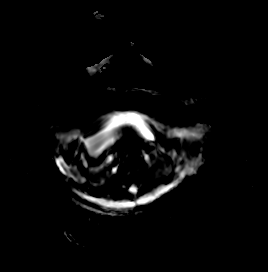

[Series 11: bSSFP · axial · 4.0mm · 0.84mm/px · z∈[-101,+183]mm · 2 of 72 slices shown]
[im 1/72]
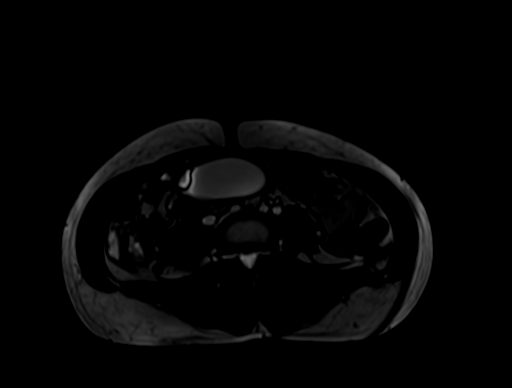
[im 72/72]
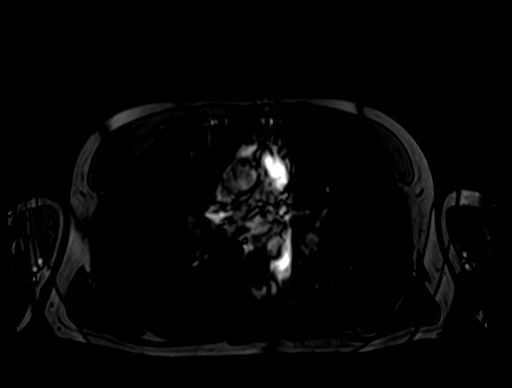

[Series 13: T1 dynamic · axial · 3.0mm · 1.25mm/px · z∈[-103,+182]mm · 3 of 96 slices shown (1 of 12)]
[im 1/96]
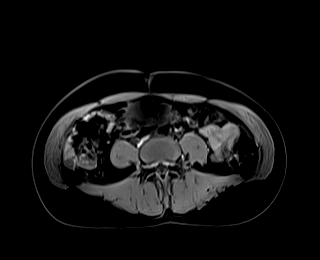
[im 48/96]
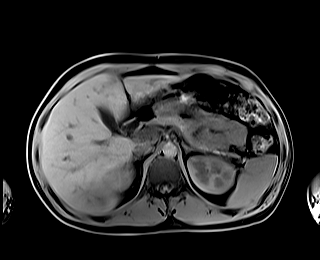
[im 96/96]
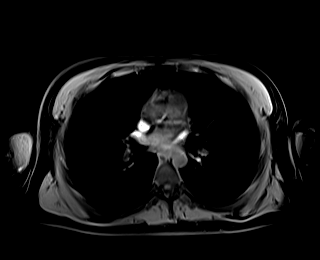

[Series 17: T1 dynamic · axial · 3.0mm · 1.25mm/px · z∈[-103,+182]mm · 3 of 96 slices shown (2 of 12)]
[im 1/96]
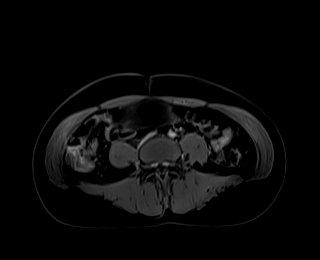
[im 48/96]
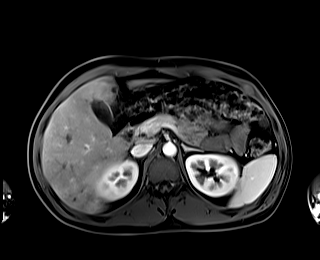
[im 96/96]
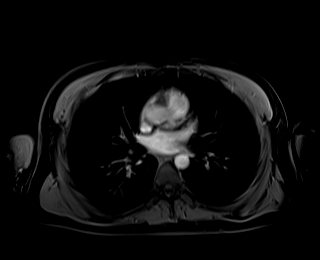

[Series 18: T1 dynamic · axial · 3.0mm · 1.25mm/px · z∈[-103,+182]mm · 3 of 96 slices shown (3 of 12)]
[im 1/96]
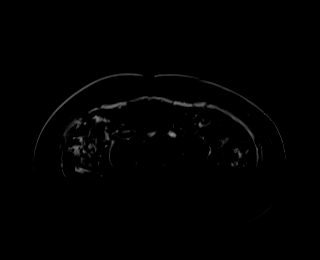
[im 48/96]
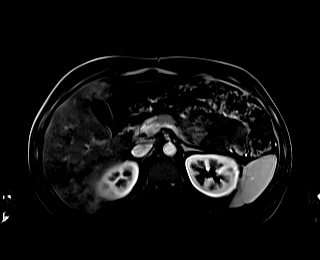
[im 96/96]
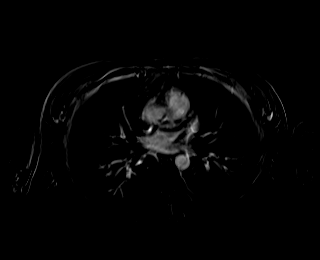

[Series 21: T1 dynamic · axial · 3.0mm · 1.25mm/px · z∈[-103,+182]mm · 3 of 96 slices shown (4 of 12)]
[im 1/96]
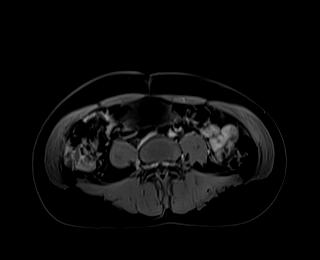
[im 48/96]
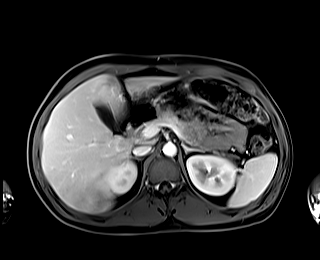
[im 96/96]
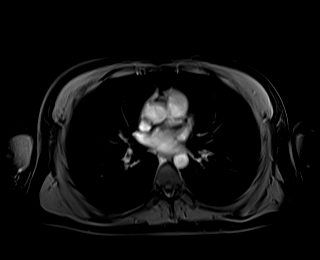

[Series 22: T1 dynamic · axial · 3.0mm · 1.25mm/px · z∈[-103,+182]mm · 3 of 96 slices shown (5 of 12)]
[im 1/96]
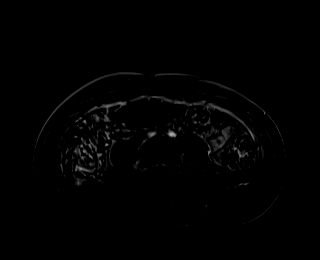
[im 48/96]
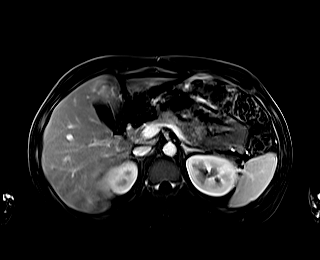
[im 96/96]
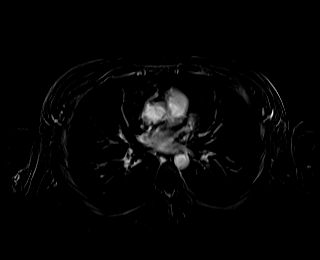

[Series 25: T1 dynamic · axial · 3.0mm · 1.25mm/px · z∈[-103,+182]mm · 3 of 96 slices shown (6 of 12)]
[im 1/96]
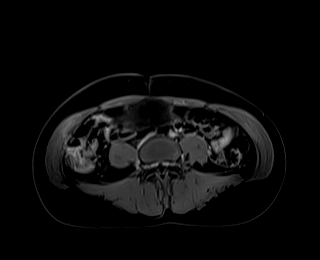
[im 48/96]
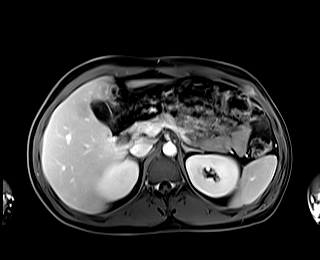
[im 96/96]
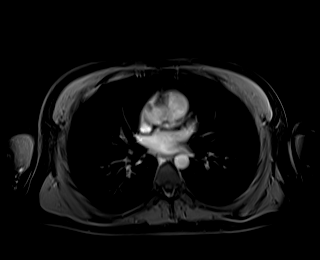

[Series 26: T1 dynamic · axial · 3.0mm · 1.25mm/px · z∈[-103,+182]mm · 3 of 96 slices shown (7 of 12)]
[im 1/96]
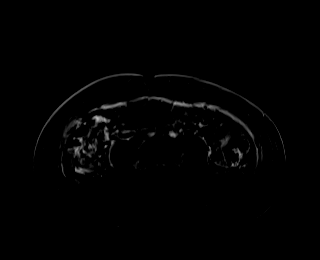
[im 48/96]
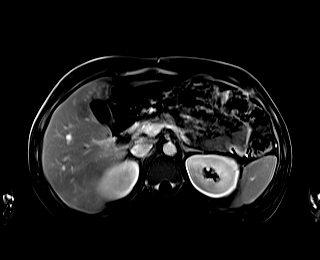
[im 96/96]
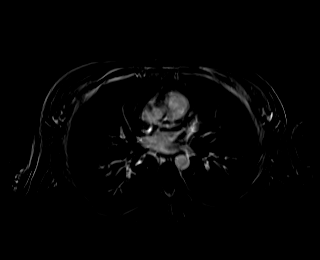

[Series 28: T1 dynamic · coronal · 3.0mm · 1.41mm/px · 2 of 72 slices shown (8 of 12)]
[im 1/72]
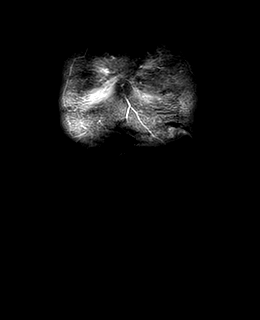
[im 72/72]
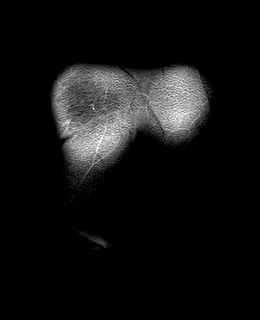

[Series 29: T2 · axial · 6.0mm · 1.56mm/px · 1 of 40 slices shown]
[im 1/40]
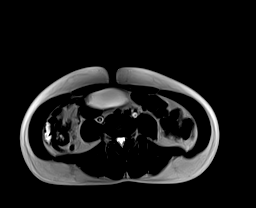

[Series 32: T1 dynamic · axial · 3.0mm · 1.25mm/px · z∈[-103,+182]mm · 3 of 96 slices shown (9 of 12)]
[im 1/96]
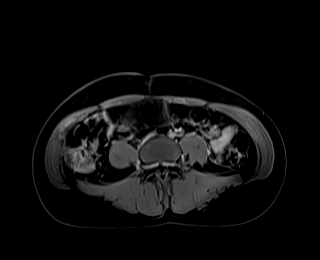
[im 48/96]
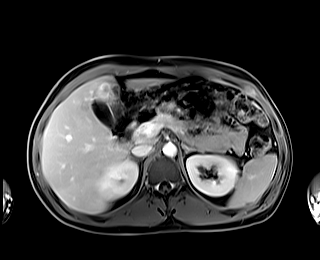
[im 96/96]
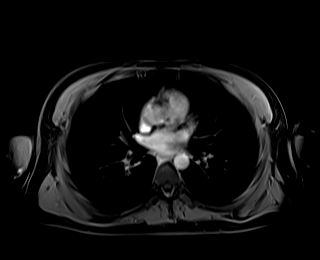

[Series 33: T1 dynamic · axial · 3.0mm · 1.25mm/px · z∈[-103,+182]mm · 3 of 96 slices shown (10 of 12)]
[im 1/96]
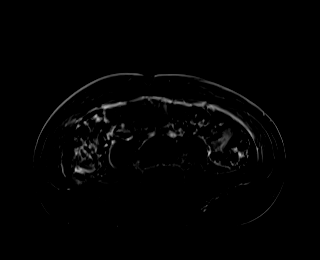
[im 48/96]
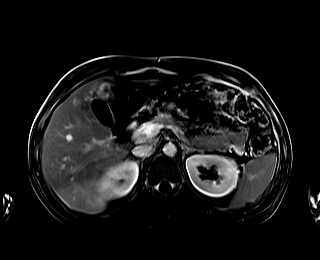
[im 96/96]
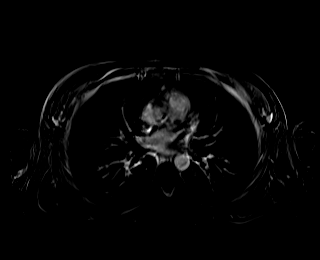

[Series 36: T1 dynamic · axial · 3.0mm · 1.25mm/px · z∈[-103,+182]mm · 3 of 96 slices shown (11 of 12)]
[im 1/96]
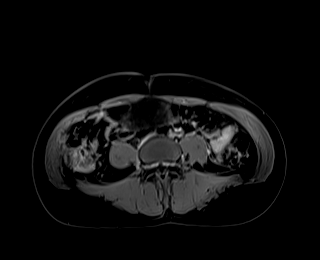
[im 48/96]
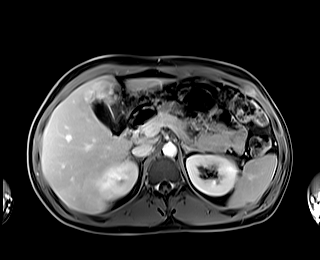
[im 96/96]
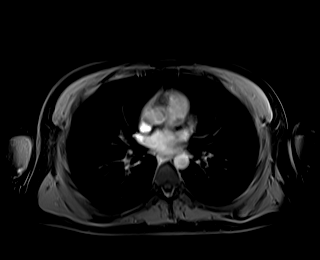

[Series 37: T1 dynamic · axial · 3.0mm · 1.25mm/px · z∈[-103,+182]mm · 3 of 96 slices shown (12 of 12)]
[im 1/96]
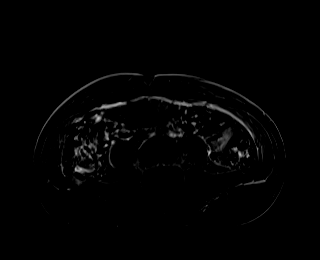
[im 48/96]
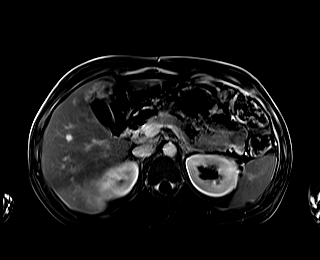
[im 96/96]
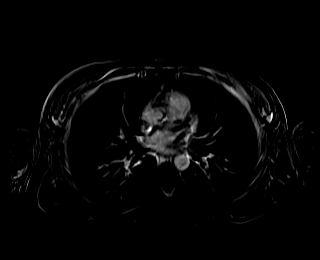

[48 of 48 positions shown; findings below may reference images not displayed]

FINDINGS: Lower chest: No acute findings.

Hepatobiliary: A complex cystic lesion is seen in segment 7 of the
right hepatic lobe, measuring 8.2 x 5.5 cm. This shows thin
peripherally enhancing wall and internal septations, and edema in
the surrounding hepatic parenchyma. This is highly suspicious for
liver abscess, with a necrotic neoplasm considered less likely. No
other hepatic abscess or mass identified. Focal fatty infiltration
is seen in segment 4 adjacent to the falciform ligament, which
corresponds to the other lesion seen in this location on prior
study.

Gallbladder is unremarkable. No evidence of biliary ductal
dilatation.

Pancreas:  No mass or inflammatory changes.

Spleen:  Within normal limits in size and appearance.

Adrenals/Urinary Tract: No masses identified. No evidence of
hydronephrosis.

Stomach/Bowel: Unremarkable.

Vascular/Lymphatic: No pathologically enlarged lymph nodes
identified. No acute vascular findings.

Other:  None.

Musculoskeletal:  No suspicious bone lesions identified.
IMPRESSION: 8.2 cm complex cystic lesion in segment 7 of the right hepatic lobe,
highly suspicious for liver abscess, with necrotic neoplasm
considered less likely. Needle aspiration should be considered.

Focal fatty infiltration in the left hepatic lobe, which corresponds
to the other lesion seen on prior CT.

## 2024-06-12 IMAGING — CT CT ABD-PELV W/ CM
2 of 4 series · 15 of 46 positions shown, 17 images · IV contrast (agent unspecified)
Comparison: None Available.

CLINICAL DATA: Nausea, vomiting, and acute nonlocalized abdominal
pain.

EXAM:
CT ABDOMEN AND PELVIS WITH CONTRAST
TECHNIQUE: Multidetector CT imaging of the abdomen and pelvis was performed
using the standard protocol following bolus administration of
intravenous contrast.

[Series 2: axial st · axial · 0.81mm/px · z∈[+836,+1271]mm · 12 of 101 slices shown, 14 images]
[im 7/101  soft-tissue]
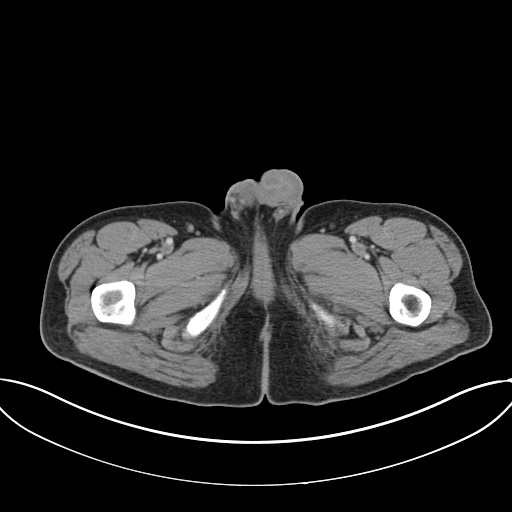
[im 7/101  bone]
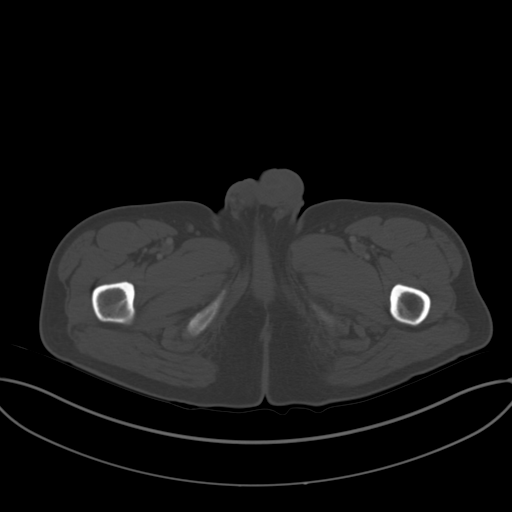
[im 13/101  soft-tissue]
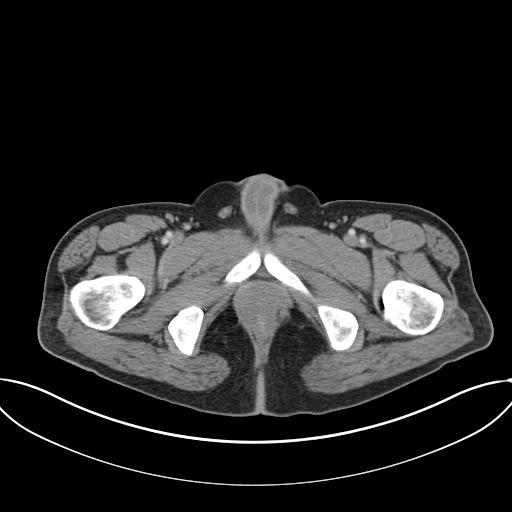
[im 26/101  soft-tissue]
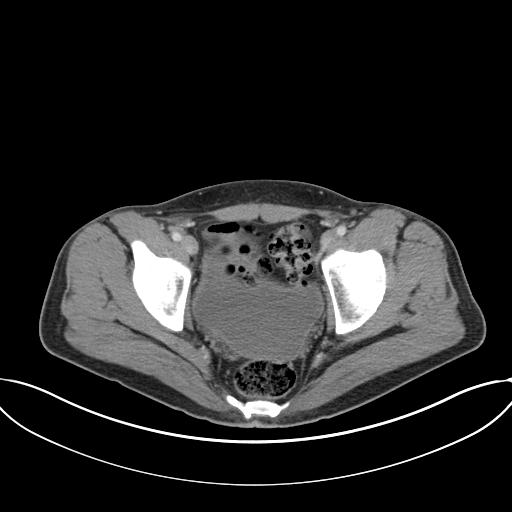
[im 32/101  soft-tissue]
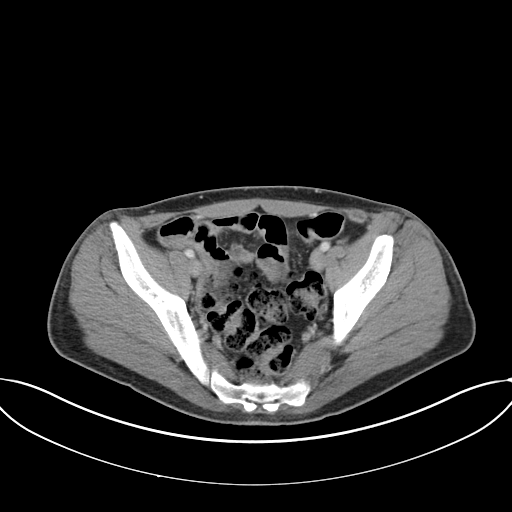
[im 38/101  soft-tissue]
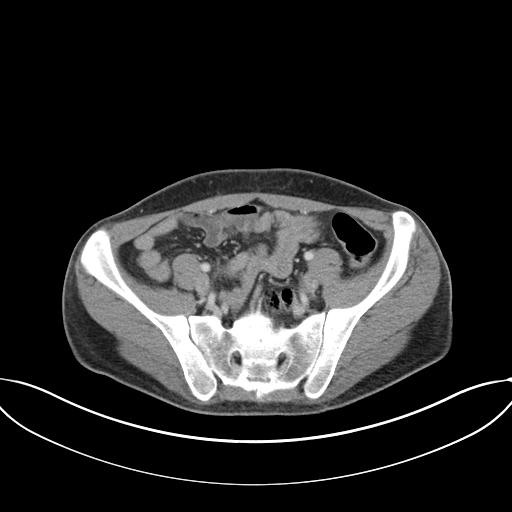
[im 44/101  soft-tissue]
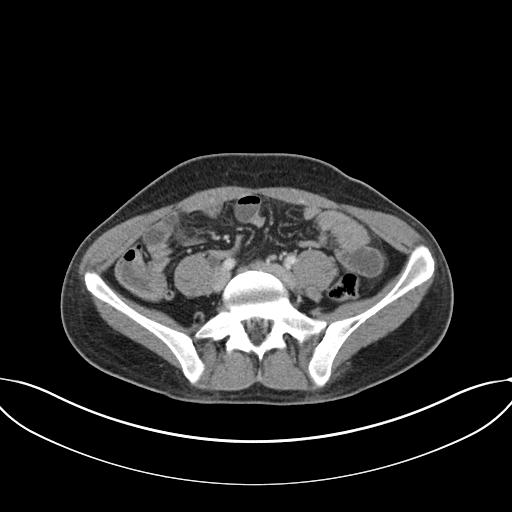
[im 57/101  soft-tissue]
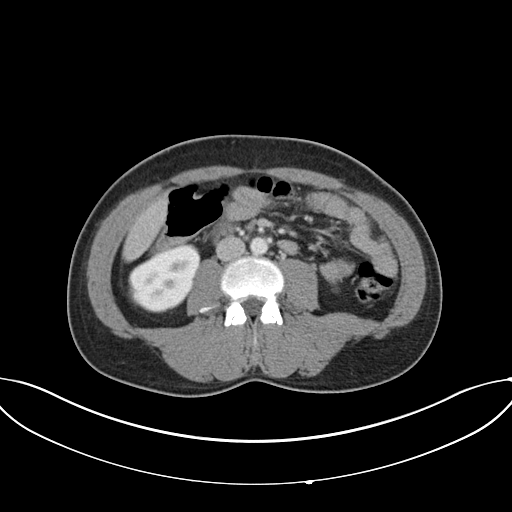
[im 63/101  soft-tissue]
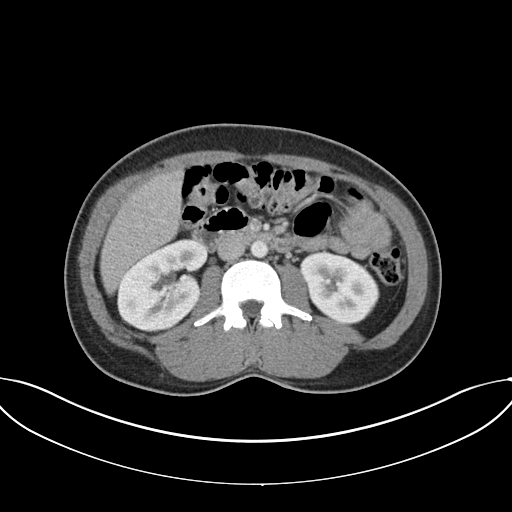
[im 69/101  soft-tissue]
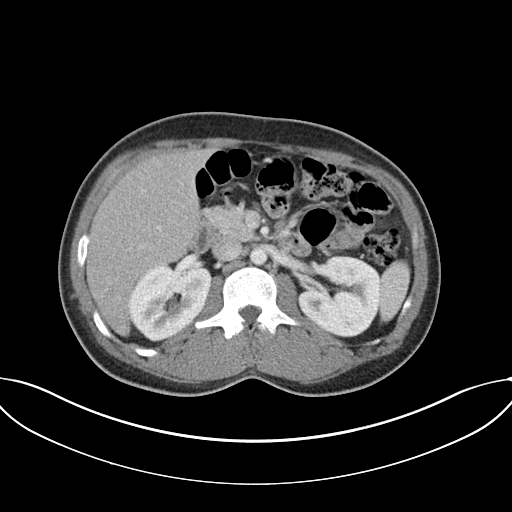
[im 69/101  bone]
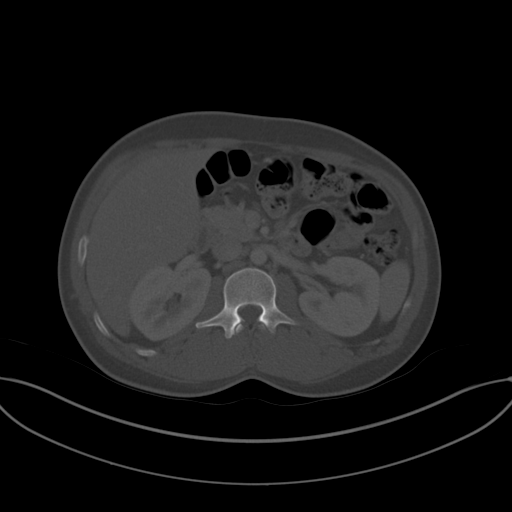
[im 76/101  soft-tissue]
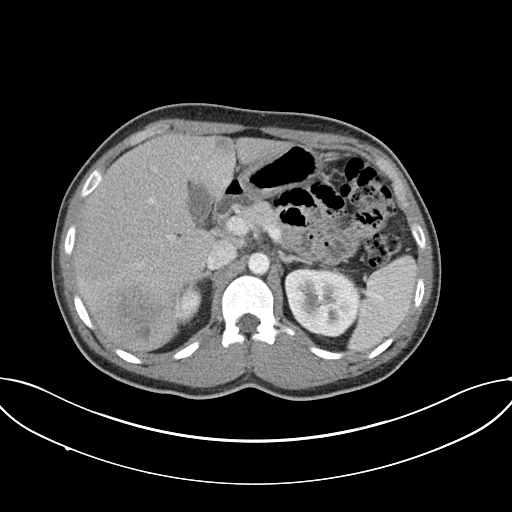
[im 88/101  soft-tissue]
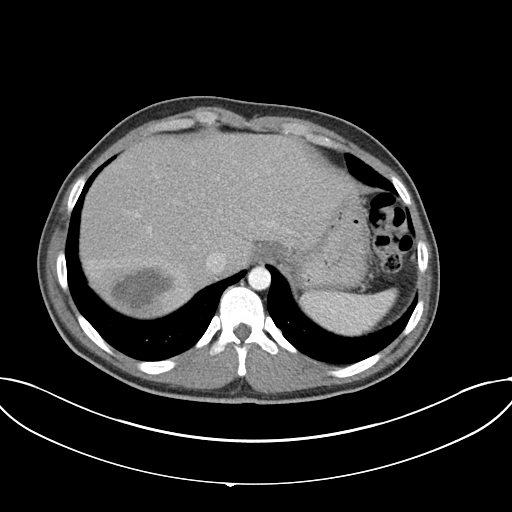
[im 94/101  soft-tissue]
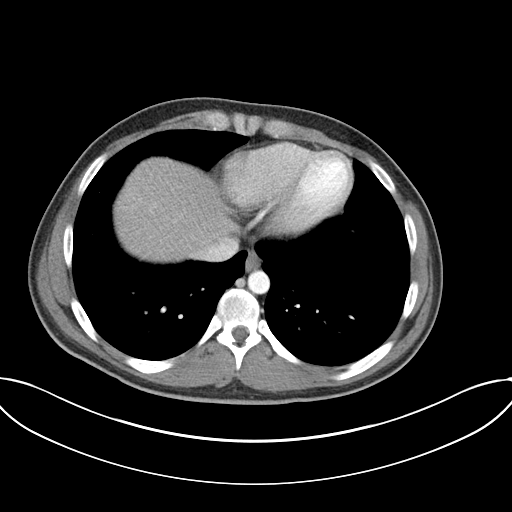

[Series 5: coronal st · coronal · 0.74mm/px · 3 of 134 slices shown]
[im 45/134  soft-tissue]
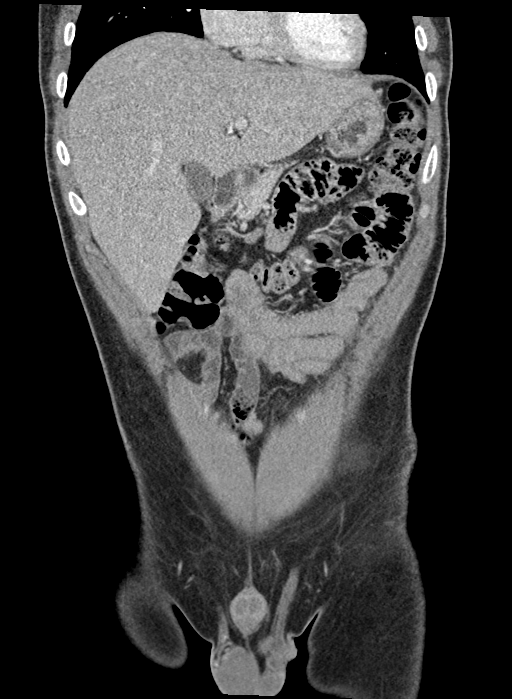
[im 60/134  soft-tissue]
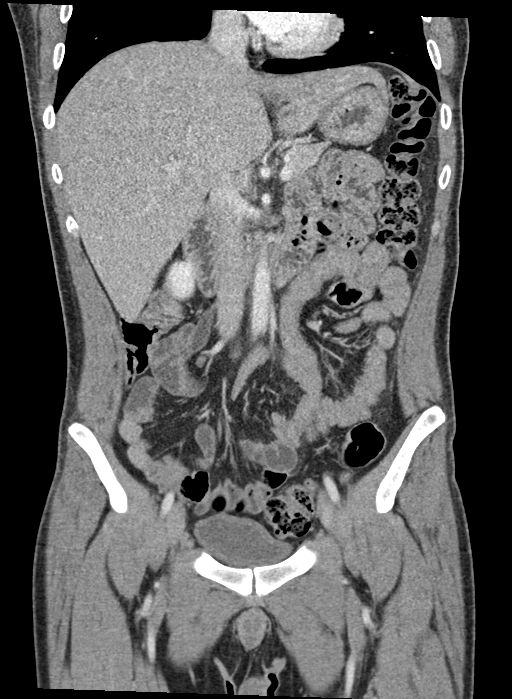
[im 74/134  soft-tissue]
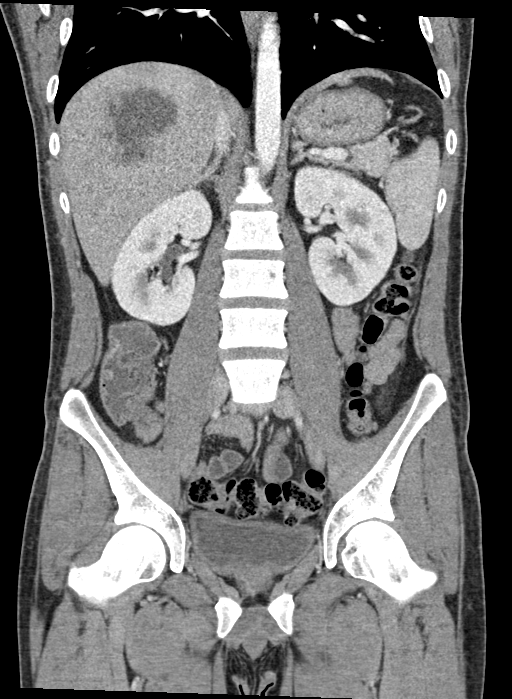

[15 of 46 positions shown; findings below may reference images not displayed]

RADIATION DOSE REDUCTION: This exam was performed according to the
departmental dose-optimization program which includes automated
exposure control, adjustment of the mA and/or kV according to
patient size and/or use of iterative reconstruction technique.

CONTRAST:  100mL OMNIPAQUE IOHEXOL 300 MG/ML  SOLN
FINDINGS: Lower chest: Lung bases are clear.

Hepatobiliary: Somewhat poorly defined hypodense liver lesion
involving the right lobe and measuring about 7 cm in maximal
diameter. A smaller circumscribed low-attenuation lesion is
identified in hepatic segment 4 measuring 1.4 cm diameter. In a
patient of this age, this is most likely to represent hepatic
abscess. Metastasis or primary liver lesion could also have this
appearance. Consider follow-up with MRI of the liver in the
nonemergent setting. Gallbladder and bile ducts are unremarkable.

Pancreas: Unremarkable. No pancreatic ductal dilatation or
surrounding inflammatory changes.

Spleen: Normal in size without focal abnormality.

Adrenals/Urinary Tract: Adrenal glands are unremarkable. Kidneys are
normal, without renal calculi, focal lesion, or hydronephrosis.
Bladder is unremarkable.

Stomach/Bowel: Stomach is within normal limits. Appendix appears
normal. No evidence of bowel wall thickening, distention, or
inflammatory changes.

Vascular/Lymphatic: No significant vascular findings are present. No
enlarged abdominal or pelvic lymph nodes.

Reproductive: Prostate gland appears enlarged for age. Seminal
vesicles are prominent. Possibly inflammatory process.

Other: No free air or free fluid in the abdomen. Abdominal wall
musculature appears intact.

Musculoskeletal: No acute or significant osseous findings.
IMPRESSION: 1. 7 cm poorly defined hypodense liver lesion in the right lobe with
a 1.4 cm lesion in segment 4. This likely represents a hepatic
abscess although primary or metastatic neoplasm could also have this
appearance. Suggest follow-up with elective MRI of the liver.
2. Prostate gland and seminal vesicles are prominent for age,
possibly inflammatory process.
